# Patient Record
Sex: Female | Born: 1935 | Smoking: Never smoker
Health system: Southern US, Community
[De-identification: ages and names within clinical notes are randomized; demographics above are authoritative.]

## PROBLEM LIST (undated history)

## (undated) DIAGNOSIS — I10 Essential (primary) hypertension: Secondary | ICD-10-CM

---

## 2015-02-12 ENCOUNTER — Emergency Department (HOSPITAL_COMMUNITY): Payer: Medicare Other

## 2015-02-12 ENCOUNTER — Inpatient Hospital Stay (HOSPITAL_COMMUNITY)
Admission: EM | Admit: 2015-02-12 | Discharge: 2015-02-19 | DRG: 481 | Disposition: A | Discharge status: Skilled Nursing Facility | Payer: Medicare Other | Attending: Family Medicine | Admitting: Family Medicine

## 2015-02-12 ENCOUNTER — Encounter (HOSPITAL_COMMUNITY): Payer: Self-pay

## 2015-02-12 DIAGNOSIS — I444 Left anterior fascicular block: Secondary | ICD-10-CM | POA: Diagnosis present

## 2015-02-12 DIAGNOSIS — M25552 Pain in left hip: Secondary | ICD-10-CM | POA: Diagnosis present

## 2015-02-12 DIAGNOSIS — D6489 Other specified anemias: Secondary | ICD-10-CM | POA: Diagnosis not present

## 2015-02-12 DIAGNOSIS — I959 Hypotension, unspecified: Secondary | ICD-10-CM | POA: Diagnosis present

## 2015-02-12 DIAGNOSIS — S72002A Fracture of unspecified part of neck of left femur, initial encounter for closed fracture: Secondary | ICD-10-CM | POA: Diagnosis not present

## 2015-02-12 DIAGNOSIS — S72002D Fracture of unspecified part of neck of left femur, subsequent encounter for closed fracture with routine healing: Secondary | ICD-10-CM | POA: Diagnosis not present

## 2015-02-12 DIAGNOSIS — Z7901 Long term (current) use of anticoagulants: Secondary | ICD-10-CM

## 2015-02-12 DIAGNOSIS — S72142S Displaced intertrochanteric fracture of left femur, sequela: Secondary | ICD-10-CM | POA: Diagnosis not present

## 2015-02-12 DIAGNOSIS — S7290XA Unspecified fracture of unspecified femur, initial encounter for closed fracture: Secondary | ICD-10-CM | POA: Diagnosis present

## 2015-02-12 DIAGNOSIS — E860 Dehydration: Secondary | ICD-10-CM | POA: Diagnosis present

## 2015-02-12 DIAGNOSIS — I950 Idiopathic hypotension: Secondary | ICD-10-CM

## 2015-02-12 DIAGNOSIS — I10 Essential (primary) hypertension: Secondary | ICD-10-CM | POA: Diagnosis present

## 2015-02-12 DIAGNOSIS — S72142A Displaced intertrochanteric fracture of left femur, initial encounter for closed fracture: Secondary | ICD-10-CM | POA: Diagnosis not present

## 2015-02-12 DIAGNOSIS — T796XXA Traumatic ischemia of muscle, initial encounter: Secondary | ICD-10-CM | POA: Diagnosis not present

## 2015-02-12 DIAGNOSIS — M6282 Rhabdomyolysis: Secondary | ICD-10-CM | POA: Diagnosis not present

## 2015-02-12 DIAGNOSIS — Y92019 Unspecified place in single-family (private) house as the place of occurrence of the external cause: Secondary | ICD-10-CM

## 2015-02-12 DIAGNOSIS — Z880 Allergy status to penicillin: Secondary | ICD-10-CM

## 2015-02-12 DIAGNOSIS — Z419 Encounter for procedure for purposes other than remedying health state, unspecified: Secondary | ICD-10-CM

## 2015-02-12 DIAGNOSIS — Z8673 Personal history of transient ischemic attack (TIA), and cerebral infarction without residual deficits: Secondary | ICD-10-CM

## 2015-02-12 DIAGNOSIS — N390 Urinary tract infection, site not specified: Secondary | ICD-10-CM | POA: Diagnosis not present

## 2015-02-12 DIAGNOSIS — E46 Unspecified protein-calorie malnutrition: Secondary | ICD-10-CM | POA: Diagnosis present

## 2015-02-12 DIAGNOSIS — S72002S Fracture of unspecified part of neck of left femur, sequela: Secondary | ICD-10-CM | POA: Diagnosis not present

## 2015-02-12 DIAGNOSIS — Z833 Family history of diabetes mellitus: Secondary | ICD-10-CM

## 2015-02-12 DIAGNOSIS — W010XXA Fall on same level from slipping, tripping and stumbling without subsequent striking against object, initial encounter: Secondary | ICD-10-CM | POA: Diagnosis present

## 2015-02-12 DIAGNOSIS — N179 Acute kidney failure, unspecified: Secondary | ICD-10-CM | POA: Diagnosis not present

## 2015-02-12 DIAGNOSIS — Z5181 Encounter for therapeutic drug level monitoring: Secondary | ICD-10-CM | POA: Diagnosis not present

## 2015-02-12 DIAGNOSIS — I493 Ventricular premature depolarization: Secondary | ICD-10-CM | POA: Diagnosis present

## 2015-02-12 DIAGNOSIS — Z09 Encounter for follow-up examination after completed treatment for conditions other than malignant neoplasm: Secondary | ICD-10-CM

## 2015-02-12 DIAGNOSIS — D649 Anemia, unspecified: Secondary | ICD-10-CM | POA: Diagnosis not present

## 2015-02-12 DIAGNOSIS — H9190 Unspecified hearing loss, unspecified ear: Secondary | ICD-10-CM | POA: Diagnosis not present

## 2015-02-12 DIAGNOSIS — Z01810 Encounter for preprocedural cardiovascular examination: Secondary | ICD-10-CM | POA: Diagnosis not present

## 2015-02-12 DIAGNOSIS — R739 Hyperglycemia, unspecified: Secondary | ICD-10-CM | POA: Diagnosis present

## 2015-02-12 DIAGNOSIS — L8990 Pressure ulcer of unspecified site, unspecified stage: Secondary | ICD-10-CM | POA: Insufficient documentation

## 2015-02-12 DIAGNOSIS — N3000 Acute cystitis without hematuria: Secondary | ICD-10-CM | POA: Diagnosis not present

## 2015-02-12 DIAGNOSIS — S7292XS Unspecified fracture of left femur, sequela: Secondary | ICD-10-CM | POA: Diagnosis not present

## 2015-02-12 HISTORY — DX: Essential (primary) hypertension: I10

## 2015-02-12 LAB — CBC WITH DIFFERENTIAL/PLATELET
BASOS PCT: 0 % (ref 0–1)
Basophils Absolute: 0 10*3/uL (ref 0.0–0.1)
Eosinophils Absolute: 0 10*3/uL (ref 0.0–0.7)
Eosinophils Relative: 0 % (ref 0–5)
HEMATOCRIT: 34.6 % — AB (ref 36.0–46.0)
HEMOGLOBIN: 11.6 g/dL — AB (ref 12.0–15.0)
LYMPHS ABS: 1.6 10*3/uL (ref 0.7–4.0)
Lymphocytes Relative: 13 % (ref 12–46)
MCH: 30 pg (ref 26.0–34.0)
MCHC: 33.5 g/dL (ref 30.0–36.0)
MCV: 89.4 fL (ref 78.0–100.0)
MONOS PCT: 12 % (ref 3–12)
Monocytes Absolute: 1.5 10*3/uL — ABNORMAL HIGH (ref 0.1–1.0)
NEUTROS ABS: 9.7 10*3/uL — AB (ref 1.7–7.7)
NEUTROS PCT: 75 % (ref 43–77)
Platelets: 221 10*3/uL (ref 150–400)
RBC: 3.87 MIL/uL (ref 3.87–5.11)
RDW: 14.7 % (ref 11.5–15.5)
WBC: 12.9 10*3/uL — ABNORMAL HIGH (ref 4.0–10.5)

## 2015-02-12 LAB — PROTIME-INR
INR: 2.28 — AB (ref 0.00–1.49)
Prothrombin Time: 24.9 seconds — ABNORMAL HIGH (ref 11.6–15.2)

## 2015-02-12 LAB — COMPREHENSIVE METABOLIC PANEL
ALT: 19 U/L (ref 14–54)
ANION GAP: 11 (ref 5–15)
AST: 51 U/L — ABNORMAL HIGH (ref 15–41)
Albumin: 3.3 g/dL — ABNORMAL LOW (ref 3.5–5.0)
Alkaline Phosphatase: 50 U/L (ref 38–126)
BUN: 37 mg/dL — ABNORMAL HIGH (ref 6–20)
CHLORIDE: 107 mmol/L (ref 101–111)
CO2: 23 mmol/L (ref 22–32)
CREATININE: 1.71 mg/dL — AB (ref 0.44–1.00)
Calcium: 8.8 mg/dL — ABNORMAL LOW (ref 8.9–10.3)
GFR calc non Af Amer: 25 mL/min — ABNORMAL LOW (ref >60)
GFR, EST AFRICAN AMERICAN: 29 mL/min — AB (ref >60)
Glucose, Bld: 144 mg/dL — ABNORMAL HIGH (ref 65–99)
POTASSIUM: 4.2 mmol/L (ref 3.5–5.1)
SODIUM: 141 mmol/L (ref 135–145)
Total Bilirubin: 1 mg/dL (ref 0.3–1.2)
Total Protein: 6.5 g/dL (ref 6.5–8.1)

## 2015-02-12 LAB — I-STAT CHEM 8, ED
BUN: 38 mg/dL — AB (ref 6–20)
CALCIUM ION: 1.15 mmol/L (ref 1.13–1.30)
CHLORIDE: 106 mmol/L (ref 101–111)
CREATININE: 1.5 mg/dL — AB (ref 0.44–1.00)
GLUCOSE: 136 mg/dL — AB (ref 65–99)
HCT: 36 % (ref 36.0–46.0)
Hemoglobin: 12.2 g/dL (ref 12.0–15.0)
Potassium: 4.1 mmol/L (ref 3.5–5.1)
Sodium: 141 mmol/L (ref 135–145)
TCO2: 22 mmol/L (ref 0–100)

## 2015-02-12 LAB — ABO/RH: ABO/RH(D): A NEG

## 2015-02-12 LAB — CK: Total CK: 1200 U/L — ABNORMAL HIGH (ref 38–234)

## 2015-02-12 MED ORDER — FENTANYL CITRATE (PF) 100 MCG/2ML IJ SOLN
50.0000 ug | Freq: Once | INTRAMUSCULAR | Status: DC
  Filled 2015-02-12: qty 2

## 2015-02-12 MED ORDER — SODIUM CHLORIDE 0.9 % IV BOLUS (SEPSIS): 500.0000 mL | Freq: Once | INTRAVENOUS | Status: DC

## 2015-02-12 MED ORDER — HYDROCODONE-ACETAMINOPHEN 5-325 MG PO TABS
1.0000 | ORAL_TABLET | ORAL | Status: AC | PRN
  Administered 2015-02-12: 1 via ORAL
  Filled 2015-02-12: qty 1

## 2015-02-12 MED ORDER — SODIUM CHLORIDE 0.9 % IV BOLUS (SEPSIS)
1000.0000 mL | Freq: Once | INTRAVENOUS | Status: AC
  Administered 2015-02-12: 1000 mL via INTRAVENOUS

## 2015-02-12 MED ORDER — FENTANYL CITRATE (PF) 100 MCG/2ML IJ SOLN
100.0000 ug | INTRAMUSCULAR | Status: DC | PRN
  Filled 2015-02-12: qty 2

## 2015-02-12 MED ORDER — HYDROCODONE-ACETAMINOPHEN 5-325 MG PO TABS
1.0000 | ORAL_TABLET | Freq: Four times a day (QID) | ORAL | Status: DC | PRN
Start: 2015-02-12 — End: 2015-02-19
  Administered 2015-02-13 – 2015-02-16 (×3): 1 via ORAL
  Administered 2015-02-16: 2 via ORAL
  Administered 2015-02-16 – 2015-02-19 (×6): 1 via ORAL
  Filled 2015-02-12 (×6): qty 1
  Filled 2015-02-12: qty 2
  Filled 2015-02-12 (×3): qty 1

## 2015-02-12 MED ORDER — SODIUM CHLORIDE 0.9 % IV SOLN
INTRAVENOUS | Status: DC
  Administered 2015-02-12 – 2015-02-17 (×5): via INTRAVENOUS

## 2015-02-12 MED ORDER — ONDANSETRON HCL 4 MG/2ML IJ SOLN
4.0000 mg | Freq: Once | INTRAMUSCULAR | Status: DC
  Filled 2015-02-12: qty 2

## 2015-02-12 MED ORDER — MORPHINE SULFATE (PF) 2 MG/ML IV SOLN
0.5000 mg | INTRAVENOUS | Status: DC | PRN
Start: 2015-02-12 — End: 2015-02-19
  Administered 2015-02-14 – 2015-02-15 (×2): 0.5 mg via INTRAVENOUS
  Filled 2015-02-12 (×2): qty 1

## 2015-02-12 MED ORDER — PHYTONADIONE 5 MG PO TABS
5.0000 mg | ORAL_TABLET | Freq: Once | ORAL | Status: AC
  Administered 2015-02-12: 5 mg via ORAL
  Filled 2015-02-12: qty 1

## 2015-02-12 MED ORDER — ONDANSETRON HCL 4 MG/2ML IJ SOLN: 4.0000 mg | Freq: Three times a day (TID) | INTRAMUSCULAR | Status: AC | PRN

## 2015-02-12 NOTE — ED Provider Notes (Signed)
CSN: [id number]     Arrival date & time 02/12/15  1306 History   First MD Initiated Contact with Patient 02/12/15 1323     Chief Complaint  Patient presents with  . Fall     (Consider location/radiation/quality/duration/timing/severity/associated sxs/prior Treatment) HPI  Bridget Pratt Is a 81 year old female who presents emergency Department with chief complaint of left hip pain. Patient was found this morning lying on the floor by her post man. Patient states that last night she was going to bed. She went to turn off her television and when she turned back around as she twisted she fell. She is unsure if she hurt her leg prior to falling or if her leg was hurt after the fall. She did not lose consciousness. She is complaining of neck pain. She denies hitting her head. Patient laid on the floor all night. She denies hitting her head. She c/o left arm pain where she laid on it all night.  Past Medical History  Diagnosis Date  . Hypertension    No past surgical history on file. History reviewed. No pertinent family history. Social History  Substance Use Topics  . Smoking status: Never Smoker   . Smokeless tobacco: None  . Alcohol Use: No   OB History    No data available     Review of Systems  Ten systems reviewed and are negative for acute change, except as noted in the HPI.    Allergies  Review of patient's allergies indicates not on file.  Home Medications   Prior to Admission medications   Not on File   BP 84/34 mmHg  Pulse 80  Temp(Src) 97.9 F (36.6 C) (Oral)  Resp 22  SpO2 99%  LMP 02/12/2015 Physical Exam  Constitutional: She appears well-developed.  Frail elderly female. She appears to be in pain  HENT:  Head: Normocephalic and atraumatic.  Very dry, sticky oral mucosa. parched lips  Eyes: Conjunctivae and EOM are normal. Pupils are equal, round, and reactive to light.  Neck: No JVD present.  Midline cervical tenderness on palpation. Placed in  cervical collar  Cardiovascular: Normal rate, regular rhythm, normal heart sounds and intact distal pulses.   Pulmonary/Chest: Breath sounds normal. No respiratory distress. She has no wheezes.  No chest wall contusions  Abdominal: Soft. Bowel sounds are normal. She exhibits no distension. There is no tenderness.  No bruising  Musculoskeletal:       Legs: Skin: She is not diaphoretic.  Nursing note and vitals reviewed.   ED Course  Procedures (including critical care time) Labs Review Labs Reviewed - No data to display  Imaging Review No results found. I have personally reviewed and evaluated these images and lab results as part of my medical decision-making.   EKG Interpretation   Date/Time:  Saturday February 12 2015 13:16:53 EDT Ventricular Rate:  67 PR Interval:  149 QRS Duration: 109 QT Interval:  436 QTC Calculation: 460 R Axis:   -56 Text Interpretation:  Sinus rhythm Atrial premature complexes Incomplete  RBBB and LAFB Abnormal R-wave progression, early transition Probable left  ventricular hypertrophy Baseline wander in lead(s) V5 question lead  placement Confirmed by MESNER MD, Barbara Cower ([id number]) on 02/12/2015 1:39:07 PM      MDM   Final diagnoses:  Hip fracture, left, closed, initial encounter  Dehydration    1:47 PM Filed Vitals:   02/12/15 1318 02/12/15 1330 02/12/15 1333  BP: 84/34 124/43   Pulse: 80 70   Temp: 97.9 F (36.6 C)  TempSrc: Oral    Resp: 22 20   Height:   5' (1.524 m)  Weight:   125 lb (56.7 kg)  SpO2: 99% 94%    This is a 81 year old female with no medical record here for review. She fell last night and appears to have a left leg deformity. This may be femur fracture versus hip fracture. Patient has range of motion of the left arm, although she laid on it last night. I ordered a total CK to rule out rhabdo. She does appear dehydrated. An initial systolic pressure was 84. Patient given a bolus of fluid. Her pressures are already rising.  Patient given fentanyl for pain. Placed in c-collar, IV ordered CT of the cervical spine as well as CT head. She is also on Coumadin and will get a PT/INR. I have ordered preop screening. EKG reviewed. Dr. Clayborne Dana has asked for repeat.    Patient with left intertrochanteric hip fracture. Patient will be admitted by Dr.Dhungel Dr. Raeford Razor will consult on the patient. He asked that she remain nothing by mouth until he sees her. He also appears dehydrated with elevated creatinine, dry oral mucosa, hypotension which resolved with fluids. Her total CK is elevated at it. I doubt rhabdomyolysis , as it is not highly elevated.    Arthor Captain, PA-C 02/12/15 1652  Marily Memos, MD 02/12/15 1743

## 2015-02-12 NOTE — H&P (Addendum)
[facility] History and Physical  Bridget Pratt ZOX:[id number] DOB: 28-Feb-1920 DOA: 02/12/2015  Referring physician: Dr. Erin Hearing PCP: Ignatius Specking., MD   Chief Complaint:  Fall at home  HPI:  81 year old female who lives alone (with a close friend helping her with her errands), independent of her ADLs and uses walker for ambulation, history of hypertension and? Blood clots on warfarin was brought to the ED after sustaining a fall at home yesterday. She reports that last evening after switching off the TV and turning to go to bed she twisted and fell on the ground. She denies any loss of consciousness landed on her left side. Denies hitting her head or the neck. She reports being on the floor for almost 18 hours and unable to get up. She denies any nausea, vomiting, chest pain, shortness of breath, palpitations, fevers, chills, headache, blurred vision, bowel or urinary symptoms. She reports following up with her PCP every month regarding her blood clot medication. Patient does not have any close family or relatives. She has a close friend lives nearby.  Course in the ED Patient was hypotensive with blood pressure of 84/34 mmHg and received 1 L IV normal saline bolus after which her blood pressure improved. She was given 50 g of fentanyl for her left hip pain. Blood will done showed mild leukocytosis, hemoglobin of 11.6 and platelets of 221. Chemistry showed sodium of 141, K of 4.2, chloride 107, CO2 23, BUN of 37 creatinine 1.71. INR was 2.28, glucose of 144. CPK was elevated to 1200. No old labs to compare. CT of the head and cervical spine was done which was negative for any injury. An x-ray of the left hip showed displaced intertrochanteric fracture of the left femur. Chest x-ray was suggestive of mild CHF findings.  Orthopedic surgeon Dr. Veda Canning  was consulted and patient admitted to hospitalist service on telemetry.  Review of Systems:  Constitutional: Denies fever, chills,  diaphoresis, appetite change and fatigue.  HEENT: Complained of neck pain and headache .  Denies visual symptoms, photophobia, congestion, cough, Respiratory: Denies SOB, DOE, cough, chest tightness,  and wheezing.   Cardiovascular: Denies chest pain, palpitations and leg swelling.  Gastrointestinal: Denies nausea, vomiting, abdominal pain, diarrhea, constipation, blood in stool and abdominal distention.  Genitourinary: Denies dysuria,  hematuria, flank pain and difficulty urinating.  Endocrine: Denies: hot or cold intolerance,  polyuria, polydipsia. Musculoskeletal: left hip pain. Denies myalgias, back pain, joint swelling, arthralgias and gait problem.  Skin: Denies pallor, rash and wound.  Neurological: Denies dizziness, seizures, syncope, weakness, light-headedness, numbness  Hematological: Denies adenopathy.  Psychiatric/Behavioral: Denies confusion  Past Medical History  Diagnosis Date  . Hypertension    No past surgical history on file. Social History:  reports that she has never smoked. She does not have any smokeless tobacco history on file. She reports that she does not drink alcohol or use illicit drugs.  Allergies  Allergen Reactions  . Penicillins Hives    Family history  Father had diabetes   Prior to Admission medications   Patient reports being on an antihypertensives and? Coumadin. No medication list available.      Physical Exam:  Filed Vitals:   02/12/15 1415 02/12/15 1500 02/12/15 1600 02/12/15 1622  BP: 124/57 112/58 128/46 126/51  Pulse: 66 69 64 70  Temp:      TempSrc:      Resp: 17 25 18 19   Height:      Weight:      SpO2: 100% 97%  97% 100%    Constitutional: Vital signs reviewed.  Elderly female in no acute distress HEENT: no pallor, no icterus, dry oral mucosa, no cervical lymphadenopathy Cardiovascular: RRR, S1 normal, S2 normal, no MRG Chest: CTAB, no wheezes, rales, or rhonchi Abdominal: Soft. Non-tender, non-distended, bowel sounds are  normal,  Ext: warm, no edema, chronic skin changes or bilateral leg. Limited mobility of left leg  Neurological: Hard of hearing, alert and oriented, nonfocal  Labs on Admission:  Basic Metabolic Panel:  Recent Labs Lab 02/12/15 1357 02/12/15 1541  NA 141 141  K 4.2 4.1  CL 107 106  CO2 23  --   GLUCOSE 144* 136*  BUN 37* 38*  CREATININE 1.71* 1.50*  CALCIUM 8.8*  --    Liver Function Tests:  Recent Labs Lab 02/12/15 1357  AST 51*  ALT 19  ALKPHOS 50  BILITOT 1.0  PROT 6.5  ALBUMIN 3.3*   No results for input(s): LIPASE, AMYLASE in the last 168 hours. No results for input(s): AMMONIA in the last 168 hours. CBC:  Recent Labs Lab 02/12/15 1357 02/12/15 1541  WBC 12.9*  --   NEUTROABS 9.7*  --   HGB 11.6* 12.2  HCT 34.6* 36.0  MCV 89.4  --   PLT 221  --    Cardiac Enzymes:  Recent Labs Lab 02/12/15 1357  CKTOTAL 1200*   BNP: Invalid input(s): POCBNP CBG: No results for input(s): GLUCAP in the last 168 hours.  Radiological Exams on Admission: Ct Head Wo Contrast  02/12/2015   CLINICAL DATA:  Found on floor this morning. Fell last night. Left hip and neck pain. Patient on Coumadin. Hypertension.  EXAM: CT HEAD WITHOUT CONTRAST  CT CERVICAL SPINE WITHOUT CONTRAST  TECHNIQUE: Multidetector CT imaging of the head and cervical spine was performed following the standard protocol without intravenous contrast. Multiplanar CT image reconstructions of the cervical spine were also generated.  COMPARISON:  None.  FINDINGS: CT HEAD FINDINGS  There is atrophy and chronic small vessel disease changes. No acute intracranial abnormality. Specifically, no hemorrhage, hydrocephalus, mass lesion, acute infarction, or significant intracranial injury. No acute calvarial abnormality. Visualized paranasal sinuses and mastoids clear. Orbital soft tissues unremarkable.  CT CERVICAL SPINE FINDINGS  Normal alignment. Diffuse degenerative disc and facet disease throughout the cervical  spine. Prevertebral soft tissues are normal. No fracture. No epidural or paraspinal hematoma.  IMPRESSION: No acute intracranial abnormality.  Atrophy, chronic microvascular disease.  Cervical spondylosis.  No acute bony abnormality.   Electronically Signed   By: Charlett Nose M.D.   On: 02/12/2015 14:56   Ct Cervical Spine Wo Contrast  02/12/2015   CLINICAL DATA:  Found on floor this morning. Fell last night. Left hip and neck pain. Patient on Coumadin. Hypertension.  EXAM: CT HEAD WITHOUT CONTRAST  CT CERVICAL SPINE WITHOUT CONTRAST  TECHNIQUE: Multidetector CT imaging of the head and cervical spine was performed following the standard protocol without intravenous contrast. Multiplanar CT image reconstructions of the cervical spine were also generated.  COMPARISON:  None.  FINDINGS: CT HEAD FINDINGS  There is atrophy and chronic small vessel disease changes. No acute intracranial abnormality. Specifically, no hemorrhage, hydrocephalus, mass lesion, acute infarction, or significant intracranial injury. No acute calvarial abnormality. Visualized paranasal sinuses and mastoids clear. Orbital soft tissues unremarkable.  CT CERVICAL SPINE FINDINGS  Normal alignment. Diffuse degenerative disc and facet disease throughout the cervical spine. Prevertebral soft tissues are normal. No fracture. No epidural or paraspinal hematoma.  IMPRESSION: No acute intracranial  abnormality.  Atrophy, chronic microvascular disease.  Cervical spondylosis.  No acute bony abnormality.   Electronically Signed   By: Charlett Nose M.D.   On: 02/12/2015 14:56   Dg Chest Port 1 View  02/12/2015   CLINICAL DATA:  81 year old female with history of fall yesterday evening found down on the floor this morning.  EXAM: PORTABLE CHEST - 1 VIEW  COMPARISON:  No priors.  FINDINGS: Lung volumes are normal. No consolidative airspace disease. No pleural effusions. Diffuse peribronchial cuffing. Mild cephalization of the pulmonary vasculature. Mild  indistinctness of interstitial markings. Mild cardiomegaly. Slight prominence of upper mediastinal contours may be projectional. Atherosclerosis in the thoracic aorta.  IMPRESSION: 1. The appearance the chest suggests mild congestive heart failure. 2. Atherosclerosis.   Electronically Signed   By: Trudie Reed M.D.   On: 02/12/2015 14:23   Dg Hip Unilat With Pelvis 2-3 Views Left  02/12/2015   CLINICAL DATA:  Fall last night with left hip pain and left leg numbness.  EXAM: DG HIP (WITH OR WITHOUT PELVIS) 2-3V LEFT  COMPARISON:  None.  FINDINGS: Exam demonstrates moderate diffuse decreased bone mineralization. There are mild symmetric degenerative changes of the hips. Compression screw and lateral fixation plate is present over the right femur intact. There is a displaced intertrochanteric fracture of the left proximal femur with mild exaggerated coxa vara deformity. Moderate posterior displacement of the distal fragment.  There are degenerative changes of the spine and sacroiliac joints. Atherosclerotic plaque is present over the femoral arteries.  IMPRESSION: Displaced intertrochanteric fracture of the left femur.   Electronically Signed   By: Elberta Fortis M.D.   On: 02/12/2015 14:43   Dg Femur 1v Left  02/12/2015   CLINICAL DATA:  Pt. Fell last night - found by postman this AM. Pain left hip and numbness left leg.  EXAM: LEFT FEMUR 1 VIEW  COMPARISON:  None.  FINDINGS: Study includes the proximal femoral shaft through the knee. The hip is not visualized on this exam.  No fracture or bone lesion on the included field of view. Knee joint is normally aligned with medial joint space compartment narrowing.  Bones are demineralized.  There are vascular calcifications medially.  IMPRESSION: No fracture or acute finding on the limited imaging acquired.   Electronically Signed   By: Amie Portland M.D.   On: 02/12/2015 14:39    EKG: Normal sinus rhythm at 95, anterior fascicular block, PVCs, no ST-T changes  (no old EKG to compare.  Assessment/Plan Principal Problem:   Closed left hip fracture Secondary to mechanical fall. Hip fracture pathway initiated. Admit to telemetry.  IV hydration with normal saline. Keep nothing by mouth. -Pain control with when necessary Vicodin and low-dose morphine. -Added bowel regimen. -Orthopedic surgery consulted by ED physician. Patient at baseline is independent of her ADLs and ambulates with the help of a walker. Should benefit from surgery. Will need skilled nursing facility upon discharge as she lives alone and does not have enough help at home.. -Ordered Foley. -EKG reviewed showing PVCs with left anterior fascicular block. No old EKG to compare. Also given congestion on chest x-ray will obtain 2-D echo to evaluate EF and rule out any wall motion abnormality preoperatively.. Patient not on any better blocker.  Per orthopedic surgery, recommend her INR to improve to 1.5 or below and will plan on or possibly on Monday.  Active Problems:   Acute kidney injury Possibly secondary to dehydration. Check UA and FeNa. He has dehydrated on  exam. Will place on IV hydration and monitor. Avoid nephrotoxins.    Hypotension Possibly secondary to dehydration. Blood pressure improved with IV fluids in the ED. Patient on antihypertensives (not sure which one). Monitor closely.  History of blood clots on anticoagulation with Coumadin Therapeutic. Holding for surgery.    Rhabdomyolysis Possibly following fall and being on the floor for almost 18 hours. Check UA. Follow with IV hydration  Leukocytosis Mild. Possibly associated with stress and dehydration. Check UA.    Blood glucose elevated Check A1c    Protein calorie malnutrition Nutrition consulted  Awaiting further information on her home meds.   Diet: regular. Surgery possibly on Monday once INR improves  DVT prophylaxis: Therapeutic INR on Coumadin.    Code Status: Discussed CODE STATUS with her. Patient  was not able to understand clearly. Would like to have everything done possible and necessary at this time Family Communication:None at bedside Disposition Plan: Admit to telemetry  [facility], [facility] Pager [phone]  Total time spent on admission :70 minutes  If 7PM-7AM, please contact night-coverage [email or web address] Password [facility] 02/12/2015, 4:37 PM

## 2015-02-12 NOTE — Progress Notes (Signed)
Orthopedic Tech Progress Note Patient Details:  Bridget Pratt [birth year] [id number]  Patient ID: Bridget Pratt, female   DOB: [birth year], 81 y.o.   MRN: [id number]   [name] 02/12/2015, 7:36 PMTrapeze bar

## 2015-02-12 NOTE — Consult Note (Signed)
Bridget Pratt is an 81 y.o. female.    Chief Complaint: left hip pain  HPI: 81 y/o female with ground level fall earlier today walking to mail box. Pt found by postman earlier this morning. Mild pain with movement of right upper extremity otherwise denies any other injuries. Pt unsure of how she fell but alert and appropriate today.   PCP:  No primary care provider on file.  PMH: Past Medical History  Diagnosis Date  . Hypertension     PSH: No past surgical history on file.  Social History:  reports that she has never smoked. She does not have any smokeless tobacco history on file. She reports that she does not drink alcohol or use illicit drugs.  Allergies:  Allergies  Allergen Reactions  . Penicillins Hives    Medications: Current Facility-Administered Medications  Medication Dose Route Frequency Provider Last Rate Last Dose  . fentaNYL (SUBLIMAZE) injection 50 mcg  50 mcg Intravenous Once Margarita Mail, PA-C   Stopped at 02/12/15 1415  . ondansetron (ZOFRAN) injection 4 mg  4 mg Intravenous Once Margarita Mail, PA-C   Stopped at 02/12/15 1415   No current outpatient prescriptions on file.    Results for orders placed or performed during the hospital encounter of 02/12/15 (from the past 48 hour(s))  Type and screen     Status: None   Collection Time: 02/12/15  1:31 PM  Result Value Ref Range   ABO/RH(D) A NEG    Antibody Screen NEG    Sample Expiration 02/15/2015   CBC WITH DIFFERENTIAL     Status: Abnormal   Collection Time: 02/12/15  1:57 PM  Result Value Ref Range   WBC 12.9 (H) 4.0 - 10.5 K/uL   RBC 3.87 3.87 - 5.11 MIL/uL   Hemoglobin 11.6 (L) 12.0 - 15.0 g/dL   HCT 34.6 (L) 36.0 - 46.0 %   MCV 89.4 78.0 - 100.0 fL   MCH 30.0 26.0 - 34.0 pg   MCHC 33.5 30.0 - 36.0 g/dL   RDW 14.7 11.5 - 15.5 %   Platelets 221 150 - 400 K/uL   Neutrophils Relative % 75 43 - 77 %   Neutro Abs 9.7 (H) 1.7 - 7.7 K/uL   Lymphocytes Relative 13 12 - 46 %   Lymphs Abs 1.6  0.7 - 4.0 K/uL   Monocytes Relative 12 3 - 12 %   Monocytes Absolute 1.5 (H) 0.1 - 1.0 K/uL   Eosinophils Relative 0 0 - 5 %   Eosinophils Absolute 0.0 0.0 - 0.7 K/uL   Basophils Relative 0 0 - 1 %   Basophils Absolute 0.0 0.0 - 0.1 K/uL  Protime-INR     Status: Abnormal   Collection Time: 02/12/15  1:57 PM  Result Value Ref Range   Prothrombin Time 24.9 (H) 11.6 - 15.2 seconds   INR 2.28 (H) 0.00 - 1.49  Comprehensive metabolic panel     Status: Abnormal   Collection Time: 02/12/15  1:57 PM  Result Value Ref Range   Sodium 141 135 - 145 mmol/L   Potassium 4.2 3.5 - 5.1 mmol/L   Chloride 107 101 - 111 mmol/L   CO2 23 22 - 32 mmol/L   Glucose, Bld 144 (H) 65 - 99 mg/dL   BUN 37 (H) 6 - 20 mg/dL   Creatinine, Ser 1.71 (H) 0.44 - 1.00 mg/dL   Calcium 8.8 (L) 8.9 - 10.3 mg/dL   Total Protein 6.5 6.5 - 8.1 g/dL   Albumin  3.3 (L) 3.5 - 5.0 g/dL   AST 51 (H) 15 - 41 U/L   ALT 19 14 - 54 U/L   Alkaline Phosphatase 50 38 - 126 U/L   Total Bilirubin 1.0 0.3 - 1.2 mg/dL   GFR calc non Af Amer 25 (L) >60 mL/min   GFR calc Af Amer 29 (L) >60 mL/min    Comment: (NOTE) The eGFR has been calculated using the CKD EPI equation. This calculation has not been validated in all clinical situations. eGFR's persistently <60 mL/min signify possible Chronic Kidney Disease.    Anion gap 11 5 - 15  CK     Status: Abnormal   Collection Time: 02/12/15  1:57 PM  Result Value Ref Range   Total CK 1200 (H) 38 - 234 U/L  I-stat chem 8, ed     Status: Abnormal   Collection Time: 02/12/15  3:41 PM  Result Value Ref Range   Sodium 141 135 - 145 mmol/L   Potassium 4.1 3.5 - 5.1 mmol/L   Chloride 106 101 - 111 mmol/L   BUN 38 (H) 6 - 20 mg/dL   Creatinine, Ser 1.50 (H) 0.44 - 1.00 mg/dL   Glucose, Bld 136 (H) 65 - 99 mg/dL   Calcium, Ion 1.15 1.13 - 1.30 mmol/L   TCO2 22 0 - 100 mmol/L   Hemoglobin 12.2 12.0 - 15.0 g/dL   HCT 36.0 36.0 - 46.0 %   Ct Head Wo Contrast  02/12/2015   CLINICAL DATA:   Found on floor this morning. Fell last night. Left hip and neck pain. Patient on Coumadin. Hypertension.  EXAM: CT HEAD WITHOUT CONTRAST  CT CERVICAL SPINE WITHOUT CONTRAST  TECHNIQUE: Multidetector CT imaging of the head and cervical spine was performed following the standard protocol without intravenous contrast. Multiplanar CT image reconstructions of the cervical spine were also generated.  COMPARISON:  None.  FINDINGS: CT HEAD FINDINGS  There is atrophy and chronic small vessel disease changes. No acute intracranial abnormality. Specifically, no hemorrhage, hydrocephalus, mass lesion, acute infarction, or significant intracranial injury. No acute calvarial abnormality. Visualized paranasal sinuses and mastoids clear. Orbital soft tissues unremarkable.  CT CERVICAL SPINE FINDINGS  Normal alignment. Diffuse degenerative disc and facet disease throughout the cervical spine. Prevertebral soft tissues are normal. No fracture. No epidural or paraspinal hematoma.  IMPRESSION: No acute intracranial abnormality.  Atrophy, chronic microvascular disease.  Cervical spondylosis.  No acute bony abnormality.   Electronically Signed   By: Rolm Baptise M.D.   On: 02/12/2015 14:56   Ct Cervical Spine Wo Contrast  02/12/2015   CLINICAL DATA:  Found on floor this morning. Fell last night. Left hip and neck pain. Patient on Coumadin. Hypertension.  EXAM: CT HEAD WITHOUT CONTRAST  CT CERVICAL SPINE WITHOUT CONTRAST  TECHNIQUE: Multidetector CT imaging of the head and cervical spine was performed following the standard protocol without intravenous contrast. Multiplanar CT image reconstructions of the cervical spine were also generated.  COMPARISON:  None.  FINDINGS: CT HEAD FINDINGS  There is atrophy and chronic small vessel disease changes. No acute intracranial abnormality. Specifically, no hemorrhage, hydrocephalus, mass lesion, acute infarction, or significant intracranial injury. No acute calvarial abnormality. Visualized  paranasal sinuses and mastoids clear. Orbital soft tissues unremarkable.  CT CERVICAL SPINE FINDINGS  Normal alignment. Diffuse degenerative disc and facet disease throughout the cervical spine. Prevertebral soft tissues are normal. No fracture. No epidural or paraspinal hematoma.  IMPRESSION: No acute intracranial abnormality.  Atrophy, chronic microvascular disease.  Cervical spondylosis.  No acute bony abnormality.   Electronically Signed   By: Rolm Baptise M.D.   On: 02/12/2015 14:56   Dg Chest Port 1 View  02/12/2015   CLINICAL DATA:  81 year old female with history of fall yesterday evening found down on the floor this morning.  EXAM: PORTABLE CHEST - 1 VIEW  COMPARISON:  No priors.  FINDINGS: Lung volumes are normal. No consolidative airspace disease. No pleural effusions. Diffuse peribronchial cuffing. Mild cephalization of the pulmonary vasculature. Mild indistinctness of interstitial markings. Mild cardiomegaly. Slight prominence of upper mediastinal contours may be projectional. Atherosclerosis in the thoracic aorta.  IMPRESSION: 1. The appearance the chest suggests mild congestive heart failure. 2. Atherosclerosis.   Electronically Signed   By: Vinnie Langton M.D.   On: 02/12/2015 14:23   Dg Hip Unilat With Pelvis 2-3 Views Left  02/12/2015   CLINICAL DATA:  Fall last night with left hip pain and left leg numbness.  EXAM: DG HIP (WITH OR WITHOUT PELVIS) 2-3V LEFT  COMPARISON:  None.  FINDINGS: Exam demonstrates moderate diffuse decreased bone mineralization. There are mild symmetric degenerative changes of the hips. Compression screw and lateral fixation plate is present over the right femur intact. There is a displaced intertrochanteric fracture of the left proximal femur with mild exaggerated coxa vara deformity. Moderate posterior displacement of the distal fragment.  There are degenerative changes of the spine and sacroiliac joints. Atherosclerotic plaque is present over the femoral arteries.   IMPRESSION: Displaced intertrochanteric fracture of the left femur.   Electronically Signed   By: Marin Olp M.D.   On: 02/12/2015 14:43   Dg Femur 1v Left  02/12/2015   CLINICAL DATA:  Pt. Fell last night - found by postman this AM. Pain left hip and numbness left leg.  EXAM: LEFT FEMUR 1 VIEW  COMPARISON:  None.  FINDINGS: Study includes the proximal femoral shaft through the knee. The hip is not visualized on this exam.  No fracture or bone lesion on the included field of view. Knee joint is normally aligned with medial joint space compartment narrowing.  Bones are demineralized.  There are vascular calcifications medially.  IMPRESSION: No fracture or acute finding on the limited imaging acquired.   Electronically Signed   By: Lajean Manes M.D.   On: 02/12/2015 14:39    ROS: ROS Lives at home Currently unable to ambulate due to recent fall  Physical Exam: Alert and appropriate 81 y/o female in no acute distress Cervical spine with full rom and no tenderness Right upper extremity: full rom with mild pain but no deformity or crepitus Left upper extremity with full rom no tenderness Left pelvis tender with mild shortening/external rotation of left lower leg Right lower extremity with full rom, no tenderness or deformity Previous right hip fracture scar from 4-5 years  Physical Exam   Assessment/Plan Assessment: left intertroch femur fracture  Plan: Medical team to admit and plan for surgical management of the left femur once INR is appropriate Recommend oral vitamin K today Plan for surgery once INR improves, likely Monday NPO for now Strict non weight bearing foley

## 2015-02-12 NOTE — ED Provider Notes (Signed)
Medical screening examination/treatment/procedure(s) were conducted as a shared visit with non-physician practitioner(s) and myself.  I personally evaluated the patient during the encounter.  Fell last night. Severe left hip pain and couldn't get up. No other injuries. Left leg shortened with pain on minimal ROM. Pulse intact. No obvious injuries. Will confirm hip fx w/ x-rays and admit to medicine.    EKG Interpretation   Date/Time:  Saturday February 12 2015 13:16:53 EDT Ventricular Rate:  67 PR Interval:  149 QRS Duration: 109 QT Interval:  436 QTC Calculation: 460 R Axis:   -56 Text Interpretation:  Sinus rhythm Atrial premature complexes Incomplete  RBBB and LAFB Abnormal R-wave progression, early transition Probable left  ventricular hypertrophy Baseline wander in lead(s) V5 question lead  placement Confirmed by [facility] MD, Kelise Kuch [phone]) on 02/12/2015 1:39:07 PM        Marily Memos, MD 02/12/15 1806

## 2015-02-12 NOTE — ED Notes (Signed)
MD at bedside. 

## 2015-02-12 NOTE — ED Notes (Addendum)
Pt. Was found this morning by the postman laying on the floor.  She reports that she fell last night before dark and was unable to get up.  She has lt. Leg and hip pain shortening noted. No rotation noted . Pt. Denies any other pain.  She was given Morphine 4 mg IV en route.  She reports that her lt. Leg and hip feels numb.  Pt. Is alert and oriented X 4.   Paramedics reports pt.  lives alone and her neighbor was going to notify her son.  Pt. Denies any chest pain no sob.  Pt. Is Hard of hearing.  Skin is warm and dry. She also reports that her Rt. Arm is sore due to her laying on it all night.  She reports also having neck pain .

## 2015-02-13 DIAGNOSIS — Z7901 Long term (current) use of anticoagulants: Secondary | ICD-10-CM

## 2015-02-13 DIAGNOSIS — S72002D Fracture of unspecified part of neck of left femur, subsequent encounter for closed fracture with routine healing: Secondary | ICD-10-CM

## 2015-02-13 DIAGNOSIS — Z5181 Encounter for therapeutic drug level monitoring: Secondary | ICD-10-CM

## 2015-02-13 DIAGNOSIS — D6489 Other specified anemias: Secondary | ICD-10-CM

## 2015-02-13 LAB — IRON AND TIBC
IRON: 60 ug/dL (ref 28–170)
Saturation Ratios: 27 % (ref 10.4–31.8)
TIBC: 220 ug/dL — AB (ref 250–450)
UIBC: 160 ug/dL

## 2015-02-13 LAB — CBC
HCT: 25.3 % — ABNORMAL LOW (ref 36.0–46.0)
Hemoglobin: 8.5 g/dL — ABNORMAL LOW (ref 12.0–15.0)
MCH: 30.4 pg (ref 26.0–34.0)
MCHC: 33.6 g/dL (ref 30.0–36.0)
MCV: 90.4 fL (ref 78.0–100.0)
PLATELETS: 157 10*3/uL (ref 150–400)
RBC: 2.8 MIL/uL — ABNORMAL LOW (ref 3.87–5.11)
RDW: 14.9 % (ref 11.5–15.5)
WBC: 9.4 10*3/uL (ref 4.0–10.5)

## 2015-02-13 LAB — BASIC METABOLIC PANEL
ANION GAP: 7 (ref 5–15)
BUN: 33 mg/dL — AB (ref 6–20)
CALCIUM: 7.7 mg/dL — AB (ref 8.9–10.3)
CO2: 22 mmol/L (ref 22–32)
Chloride: 109 mmol/L (ref 101–111)
Creatinine, Ser: 1.28 mg/dL — ABNORMAL HIGH (ref 0.44–1.00)
GFR calc Af Amer: 40 mL/min — ABNORMAL LOW (ref >60)
GFR, EST NON AFRICAN AMERICAN: 35 mL/min — AB (ref >60)
GLUCOSE: 132 mg/dL — AB (ref 65–99)
Potassium: 3.9 mmol/L (ref 3.5–5.1)
SODIUM: 138 mmol/L (ref 135–145)

## 2015-02-13 LAB — URINE MICROSCOPIC-ADD ON

## 2015-02-13 LAB — URINALYSIS, ROUTINE W REFLEX MICROSCOPIC
BILIRUBIN URINE: NEGATIVE
Glucose, UA: NEGATIVE mg/dL
KETONES UR: NEGATIVE mg/dL
NITRITE: NEGATIVE
Protein, ur: NEGATIVE mg/dL
Specific Gravity, Urine: 1.02 (ref 1.005–1.030)
UROBILINOGEN UA: 0.2 mg/dL (ref 0.0–1.0)
pH: 5 (ref 5.0–8.0)

## 2015-02-13 LAB — PROTIME-INR
INR: 2.43 — ABNORMAL HIGH (ref 0.00–1.49)
Prothrombin Time: 26.2 seconds — ABNORMAL HIGH (ref 11.6–15.2)

## 2015-02-13 LAB — VITAMIN B12: VITAMIN B 12: 377 pg/mL (ref 180–914)

## 2015-02-13 LAB — CREATININE, URINE, RANDOM: Creatinine, Urine: 100.35 mg/dL

## 2015-02-13 LAB — CK: Total CK: 891 U/L — ABNORMAL HIGH (ref 38–234)

## 2015-02-13 LAB — PREPARE RBC (CROSSMATCH)

## 2015-02-13 LAB — SODIUM, URINE, RANDOM: SODIUM UR: 100 mmol/L

## 2015-02-13 MED ORDER — VITAMIN K1 10 MG/ML IJ SOLN
5.0000 mg | Freq: Once | INTRAMUSCULAR | Status: AC
  Administered 2015-02-13: 5 mg via SUBCUTANEOUS
  Filled 2015-02-13: qty 0.5

## 2015-02-13 MED ORDER — SODIUM CHLORIDE 0.9 % IV SOLN: Freq: Once | INTRAVENOUS | Status: DC

## 2015-02-13 MED ORDER — ACETAMINOPHEN 325 MG PO TABS
650.0000 mg | ORAL_TABLET | Freq: Once | ORAL | Status: AC
  Administered 2015-02-13: 650 mg via ORAL
  Filled 2015-02-13: qty 2

## 2015-02-13 NOTE — Progress Notes (Signed)
Hgb 8.5 today INR 2.4  Needs IM nail L hip when medically ready Recommend transfusion of PRBCs preop Hold coumadin OR tomorrow hopefully

## 2015-02-13 NOTE — Progress Notes (Addendum)
[facility] PROGRESS NOTE  Bridget Pratt ZOX:[id number] DOB: 1920/04/01 DOA: 02/12/2015 PCP: Ignatius Specking., MD  Brief narrative: 81 y/o female with HTN, on coumadin for ?DVT, independent for ADLs and uses walker for ambulation presented to the ED after sustaining a fall at home with left hip fracture. She also had elevated CPK and acute kidney injury.   Assessment/Plan: Closed left hip fracture Secondary to mechanical fall. Pain control with when necessary Vicodin and low-dose morphine. Bowel regimen added. Foley placed on admission. Holding Coumadin for surgery. Ordered vitamin K for INR reversal. 2-D echo ordered to evaluate LV function.  Acute kidney injury Secondary to dehydration and patient on HCTZ-valsartan. Blood pressure medications held.. Improving with IV fluids  Acute rhabdomyolysis Secondary to fall and being on the floor for several hours. Patient also on statin which has been discontinued. Improving with IV fluids. LFTs normal.  Anemia Significant drop in H&H since admission. No baseline unknown. Check stool for occult blood. 2 2 units PRBC ordered.  Protein calorie malnutrition Nutrition consult.  DVT prophylaxis: INR supratherapeutic  Diet: Regular  Code Staff: full code Family Communication: will son in [place] to update (phone [phone]) Disposition Plan: currently inpt   Consult Orthopedics  Procedures:  CT head and cervical spine   antibiotics None  Subjective Patient seen and examined. Reports her pain to be stable with medications.  Objective: Filed Vitals:   02/13/15 0522  BP: 122/45  Pulse: 67  Temp: 98.6 F (37 C)  Resp: 17    Intake/Output Summary (Last 24 hours) at 02/13/15 1134 Last data filed at 02/13/15 1040  Gross per 24 hour  Intake    620 ml  Output    400 ml  Net    220 ml   Filed Weights   02/12/15 1333  Weight: 56.7 kg (125 lb)    Exam:   Elderly female in no acute distress, hard of  hearing  HEENT: No pallor, moist oral mucosa  Chest: Clear to auscultation bilaterally  CVS: Normal S1 and S2, no murmurs rub or gallop  GI: Soft, nondistended, nontender, bowel sounds present  musculoskeletal: Warm, no edema  CNS: Alert and oriented  Data Reviewed: Basic Metabolic Panel:  Recent Labs Lab 02/12/15 1357 02/12/15 1541 02/13/15 0539  NA 141 141 138  K 4.2 4.1 3.9  CL 107 106 109  CO2 23  --  22  GLUCOSE 144* 136* 132*  BUN 37* 38* 33*  CREATININE 1.71* 1.50* 1.28*  CALCIUM 8.8*  --  7.7*   Liver Function Tests:  Recent Labs Lab 02/12/15 1357  AST 51*  ALT 19  ALKPHOS 50  BILITOT 1.0  PROT 6.5  ALBUMIN 3.3*   No results for input(s): LIPASE, AMYLASE in the last 168 hours. No results for input(s): AMMONIA in the last 168 hours. CBC:  Recent Labs Lab 02/12/15 1357 02/12/15 1541 02/13/15 0539  WBC 12.9*  --  9.4  NEUTROABS 9.7*  --   --   HGB 11.6* 12.2 8.5*  HCT 34.6* 36.0 25.3*  MCV 89.4  --  90.4  PLT 221  --  157   Cardiac Enzymes:  Recent Labs Lab 02/12/15 1357 02/13/15 0539  CKTOTAL 1200* 891*   BNP (last 3 results) No results for input(s): BNP in the last 8760 hours.  ProBNP (last 3 results) No results for input(s): PROBNP in the last 8760 hours.  CBG: No results for input(s): GLUCAP in the last 168 hours.  No results found for this  or any previous visit (from the past 240 hour(s)).   Studies: Ct Head Wo Contrast  02/12/2015   CLINICAL DATA:  Found on floor this morning. Fell last night. Left hip and neck pain. Patient on Coumadin. Hypertension.  EXAM: CT HEAD WITHOUT CONTRAST  CT CERVICAL SPINE WITHOUT CONTRAST  TECHNIQUE: Multidetector CT imaging of the head and cervical spine was performed following the standard protocol without intravenous contrast. Multiplanar CT image reconstructions of the cervical spine were also generated.  COMPARISON:  None.  FINDINGS: CT HEAD FINDINGS  There is atrophy and chronic small vessel  disease changes. No acute intracranial abnormality. Specifically, no hemorrhage, hydrocephalus, mass lesion, acute infarction, or significant intracranial injury. No acute calvarial abnormality. Visualized paranasal sinuses and mastoids clear. Orbital soft tissues unremarkable.  CT CERVICAL SPINE FINDINGS  Normal alignment. Diffuse degenerative disc and facet disease throughout the cervical spine. Prevertebral soft tissues are normal. No fracture. No epidural or paraspinal hematoma.  IMPRESSION: No acute intracranial abnormality.  Atrophy, chronic microvascular disease.  Cervical spondylosis.  No acute bony abnormality.   Electronically Signed   By: Charlett Nose M.D.   On: 02/12/2015 14:56   Ct Cervical Spine Wo Contrast  02/12/2015   CLINICAL DATA:  Found on floor this morning. Fell last night. Left hip and neck pain. Patient on Coumadin. Hypertension.  EXAM: CT HEAD WITHOUT CONTRAST  CT CERVICAL SPINE WITHOUT CONTRAST  TECHNIQUE: Multidetector CT imaging of the head and cervical spine was performed following the standard protocol without intravenous contrast. Multiplanar CT image reconstructions of the cervical spine were also generated.  COMPARISON:  None.  FINDINGS: CT HEAD FINDINGS  There is atrophy and chronic small vessel disease changes. No acute intracranial abnormality. Specifically, no hemorrhage, hydrocephalus, mass lesion, acute infarction, or significant intracranial injury. No acute calvarial abnormality. Visualized paranasal sinuses and mastoids clear. Orbital soft tissues unremarkable.  CT CERVICAL SPINE FINDINGS  Normal alignment. Diffuse degenerative disc and facet disease throughout the cervical spine. Prevertebral soft tissues are normal. No fracture. No epidural or paraspinal hematoma.  IMPRESSION: No acute intracranial abnormality.  Atrophy, chronic microvascular disease.  Cervical spondylosis.  No acute bony abnormality.   Electronically Signed   By: Charlett Nose M.D.   On: 02/12/2015  14:56   Dg Chest Port 1 View  02/12/2015   CLINICAL DATA:  82 year old female with history of fall yesterday evening found down on the floor this morning.  EXAM: PORTABLE CHEST - 1 VIEW  COMPARISON:  No priors.  FINDINGS: Lung volumes are normal. No consolidative airspace disease. No pleural effusions. Diffuse peribronchial cuffing. Mild cephalization of the pulmonary vasculature. Mild indistinctness of interstitial markings. Mild cardiomegaly. Slight prominence of upper mediastinal contours may be projectional. Atherosclerosis in the thoracic aorta.  IMPRESSION: 1. The appearance the chest suggests mild congestive heart failure. 2. Atherosclerosis.   Electronically Signed   By: Trudie Reed M.D.   On: 02/12/2015 14:23   Dg Hip Unilat With Pelvis 2-3 Views Left  02/12/2015   CLINICAL DATA:  Fall last night with left hip pain and left leg numbness.  EXAM: DG HIP (WITH OR WITHOUT PELVIS) 2-3V LEFT  COMPARISON:  None.  FINDINGS: Exam demonstrates moderate diffuse decreased bone mineralization. There are mild symmetric degenerative changes of the hips. Compression screw and lateral fixation plate is present over the right femur intact. There is a displaced intertrochanteric fracture of the left proximal femur with mild exaggerated coxa vara deformity. Moderate posterior displacement of the distal fragment.  There  are degenerative changes of the spine and sacroiliac joints. Atherosclerotic plaque is present over the femoral arteries.  IMPRESSION: Displaced intertrochanteric fracture of the left femur.   Electronically Signed   By: Elberta Fortis M.D.   On: 02/12/2015 14:43   Dg Femur 1v Left  02/12/2015   CLINICAL DATA:  Pt. Fell last night - found by postman this AM. Pain left hip and numbness left leg.  EXAM: LEFT FEMUR 1 VIEW  COMPARISON:  None.  FINDINGS: Study includes the proximal femoral shaft through the knee. The hip is not visualized on this exam.  No fracture or bone lesion on the included field of  view. Knee joint is normally aligned with medial joint space compartment narrowing.  Bones are demineralized.  There are vascular calcifications medially.  IMPRESSION: No fracture or acute finding on the limited imaging acquired.   Electronically Signed   By: Amie Portland M.D.   On: 02/12/2015 14:39    Scheduled Meds: . sodium chloride   Intravenous Once  . fentaNYL (SUBLIMAZE) injection  50 mcg Intravenous Once  . ondansetron (ZOFRAN) IV  4 mg Intravenous Once   Continuous Infusions: . sodium chloride 100 mL/hr at 02/12/15 1818      Time spent: 25 MINUTES    Bridget Pratt  [facility] Pager  [phone]. If 7PM-7AM, please contact night-coverage at [email or web address], password [facility] 02/13/2015, 11:34 AM  LOS: 1 day

## 2015-02-13 NOTE — Progress Notes (Signed)
Lab called this RN and informed about patient's hgb level from 12.2 yesterday drastically went down to 8.5. Will pass to oncoming nurse. Nursing will continue to monitor.

## 2015-02-13 NOTE — Progress Notes (Addendum)
Patient has an order for RBC transfusion. Before transfusion , pt temp was 100.1. Notified Dr. Gonzella Lex. And he said that continue with blood transfusion order. Patient received RBC transfusion per MD order.

## 2015-02-14 ENCOUNTER — Inpatient Hospital Stay (HOSPITAL_COMMUNITY): Payer: Medicare Other

## 2015-02-14 ENCOUNTER — Encounter (HOSPITAL_COMMUNITY): Payer: Self-pay | Admitting: *Deleted

## 2015-02-14 DIAGNOSIS — N3000 Acute cystitis without hematuria: Secondary | ICD-10-CM

## 2015-02-14 DIAGNOSIS — Z01810 Encounter for preprocedural cardiovascular examination: Secondary | ICD-10-CM

## 2015-02-14 LAB — BASIC METABOLIC PANEL
ANION GAP: 7 (ref 5–15)
BUN: 23 mg/dL — AB (ref 6–20)
CALCIUM: 7.7 mg/dL — AB (ref 8.9–10.3)
CO2: 22 mmol/L (ref 22–32)
Chloride: 109 mmol/L (ref 101–111)
Creatinine, Ser: 1.18 mg/dL — ABNORMAL HIGH (ref 0.44–1.00)
GFR calc Af Amer: 45 mL/min — ABNORMAL LOW (ref >60)
GFR, EST NON AFRICAN AMERICAN: 39 mL/min — AB (ref >60)
GLUCOSE: 112 mg/dL — AB (ref 65–99)
POTASSIUM: 3.9 mmol/L (ref 3.5–5.1)
Sodium: 138 mmol/L (ref 135–145)

## 2015-02-14 LAB — CBC
HEMATOCRIT: 29.4 % — AB (ref 36.0–46.0)
Hemoglobin: 9.7 g/dL — ABNORMAL LOW (ref 12.0–15.0)
MCH: 28.9 pg (ref 26.0–34.0)
MCHC: 33 g/dL (ref 30.0–36.0)
MCV: 87.5 fL (ref 78.0–100.0)
PLATELETS: 133 10*3/uL — AB (ref 150–400)
RBC: 3.36 MIL/uL — ABNORMAL LOW (ref 3.87–5.11)
RDW: 15.9 % — AB (ref 11.5–15.5)
WBC: 9.6 10*3/uL (ref 4.0–10.5)

## 2015-02-14 LAB — SURGICAL PCR SCREEN
MRSA, PCR: NEGATIVE
STAPHYLOCOCCUS AUREUS: NEGATIVE

## 2015-02-14 LAB — PROTIME-INR
INR: 1.49 (ref 0.00–1.49)
Prothrombin Time: 18.1 seconds — ABNORMAL HIGH (ref 11.6–15.2)

## 2015-02-14 LAB — CK: Total CK: 573 U/L — ABNORMAL HIGH (ref 38–234)

## 2015-02-14 MED ORDER — WHITE PETROLATUM GEL
Status: AC
  Administered 2015-02-14: 22:00:00
  Filled 2015-02-14: qty 1

## 2015-02-14 MED ORDER — HEPARIN (PORCINE) IN NACL 100-0.45 UNIT/ML-% IJ SOLN
1000.0000 [IU]/h | INTRAMUSCULAR | Status: DC
  Administered 2015-02-14: 1000 [IU]/h via INTRAVENOUS
  Filled 2015-02-14: qty 250

## 2015-02-14 MED ORDER — DEXTROSE 5 % IV SOLN
1.0000 g | INTRAVENOUS | Status: DC
  Administered 2015-02-14 – 2015-02-19 (×6): 1 g via INTRAVENOUS
  Filled 2015-02-14 (×6): qty 10

## 2015-02-14 MED ORDER — ENSURE ENLIVE PO LIQD
237.0000 mL | Freq: Two times a day (BID) | ORAL | Status: DC
  Administered 2015-02-15 – 2015-02-19 (×8): 237 mL via ORAL

## 2015-02-14 MED ORDER — INFLUENZA VAC SPLIT QUAD 0.5 ML IM SUSY
0.5000 mL | PREFILLED_SYRINGE | INTRAMUSCULAR | Status: AC
Start: 2015-02-15 — End: 2015-02-16
  Administered 2015-02-16: 0.5 mL via INTRAMUSCULAR
  Filled 2015-02-14: qty 0.5

## 2015-02-14 NOTE — Progress Notes (Signed)
ANTICOAGULATION CONSULT NOTE - Initial Consult  Pharmacy Consult for Heparin Indication: DVT  Allergies  Allergen Reactions  . Penicillins Hives    Patient Measurements: Height: 5' (152.4 cm) Weight: 125 lb (56.7 kg) IBW/kg (Calculated) : 45.5 Heparin Dosing Weight: 56.7 kg  Vital Signs: Temp: 98.5 F (36.9 C) (08/29 1942) Temp Source: Oral (08/29 1942) BP: 136/58 mmHg (08/29 1942) Pulse Rate: 62 (08/29 1942)  Labs:  Recent Labs  02/12/15 1357 02/12/15 1541 02/13/15 0539 02/14/15 0341  HGB 11.6* 12.2 8.5* 9.7*  HCT 34.6* 36.0 25.3* 29.4*  PLT 221  --  157 133*  LABPROT 24.9*  --  26.2* 18.1*  INR 2.28*  --  2.43* 1.49  CREATININE 1.71* 1.50* 1.28* 1.18*  CKTOTAL 1200*  --  891* 573*    Estimated Creatinine Clearance: 23.5 mL/min (by C-G formula based on Cr of 1.18).   Medical History: Past Medical History  Diagnosis Date  . Hypertension     Assessment: 81 year old female on PTA warfarin for DVT who presented to the ED after a mechanical fall. She was supposed to go to surgery today for left hip fracture but it has been postponed until tomorrow (02/15/15). Per Dr. Linna Caprice, start heparin drip tonight and then stop tomorrow morning at 0800.  Goal of Therapy:  Heparin level 0.3-0.7 units/ml Monitor platelets by anticoagulation protocol: Yes   Plan:  - Start heparin 100 units/hr with no bolus - Place stop time for heparin (0800 on 02/15/15) - Follow up plans for anticoagulation after surgery 02/15/15  Juanita Craver, PharmD, BCPS Clinical Pharmacist [phone]

## 2015-02-14 NOTE — Progress Notes (Signed)
Due to OR scheduling, surgery postponed to tomorrow around 2pm.   OK to start heparin gtt tonight, stop tomorrow morning (02/15/15) at 0800 NPO after MN tonight

## 2015-02-14 NOTE — Progress Notes (Signed)
[facility] PROGRESS NOTE  Bridget Pratt MVH:[id number] DOB: [birth year] DOA: 02/12/2015 PCP: Ignatius Specking., MD  Brief narrative: 81 y/o female with HTN, on coumadin for ?DVT, independent for ADLs and uses walker for ambulation presented to the ED after sustaining a fall at home with left hip fracture. She also had elevated CPK and acute kidney injury.   Assessment/Plan: Closed left hip fracture Secondary to mechanical fall. Pain control with when necessary Vicodin and low-dose morphine. Bowel regimen . Foley placed on admission. Holding Coumadin for surgery.  -INR reversed with vitamin K. <1.5 this morning. 2-D echo ordered to evaluate LV function.  Acute kidney injury Secondary to dehydration and patient on HCTZ-valsartan. Blood pressure medications held.. Improving with IV fluids  Acute rhabdomyolysis Secondary to fall and being on the floor for several hours. Patient also on statin which has been discontinued. Improving with IV fluids. LFTs normal.  Fever on 8/28 MAXIMUM TEMPERATURE of 100.4. Admission chest x-ray unremarkable but UA suggests a possible UTI. Will place on empiric Rocephin and monitor..   Anemia Significant drop in H&H since admission. No baseline unknown.  Stool for occult blood pending. Improved with 2 units PRBC on 8/28.  Protein calorie malnutrition Nutrition consult.  DVT prophylaxis: Resume  Diet: Regular  Code Staff: full code Family Communication: Spoke with son in [place] on 8/28 (phone [phone]) Disposition Plan: Needs skilled nursing facility postop. Possible discharge on 8/31   Consult Orthopedics (Dr Veda Canning)  Procedures:  CT head and cervical spine   antibiotics None  Subjective Patient seen and examined. Left hip pain better. Received 2 u PRBC yesterday . Had fever of 100.38F last evening. Denies any cough, chest congestion or dysuria.  Objective: Filed Vitals:   02/14/15 0511  BP: 131/55  Pulse: 73  Temp: 100.2 F  (37.9 C)  Resp: 17    Intake/Output Summary (Last 24 hours) at 02/14/15 0931 Last data filed at 02/14/15 2725  Gross per 24 hour  Intake   2470 ml  Output   1775 ml  Net    695 ml   Filed Weights   02/12/15 1333  Weight: 56.7 kg (125 lb)    Exam:   Elderly female in no acute distress, hard of hearing  HEENT: No pallor, moist oral mucosa  Chest: Clear to auscultation bilaterally  CVS: Normal S1 and S2, no murmurs rub or gallop  GI: Soft, nondistended, nontender, bowel sounds present , Foley placed on admission.  musculoskeletal: Warm, no edema   CNS: Alert and oriented  Data Reviewed: Basic Metabolic Panel:  Recent Labs Lab 02/12/15 1357 02/12/15 1541 02/13/15 0539 02/14/15 0341  NA 141 141 138 138  K 4.2 4.1 3.9 3.9  CL 107 106 109 109  CO2 23  --  22 22  GLUCOSE 144* 136* 132* 112*  BUN 37* 38* 33* 23*  CREATININE 1.71* 1.50* 1.28* 1.18*  CALCIUM 8.8*  --  7.7* 7.7*   Liver Function Tests:  Recent Labs Lab 02/12/15 1357  AST 51*  ALT 19  ALKPHOS 50  BILITOT 1.0  PROT 6.5  ALBUMIN 3.3*   No results for input(s): LIPASE, AMYLASE in the last 168 hours. No results for input(s): AMMONIA in the last 168 hours. CBC:  Recent Labs Lab 02/12/15 1357 02/12/15 1541 02/13/15 0539 02/14/15 0341  WBC 12.9*  --  9.4 9.6  NEUTROABS 9.7*  --   --   --   HGB 11.6* 12.2 8.5* 9.7*  HCT 34.6* 36.0 25.3* 29.4*  MCV 89.4  --  90.4 87.5  PLT 221  --  157 133*   Cardiac Enzymes:  Recent Labs Lab 02/12/15 1357 02/13/15 0539 02/14/15 0341  CKTOTAL 1200* 891* 573*   BNP (last 3 results) No results for input(s): BNP in the last 8760 hours.  ProBNP (last 3 results) No results for input(s): PROBNP in the last 8760 hours.  CBG: No results for input(s): GLUCAP in the last 168 hours.  No results found for this or any previous visit (from the past 240 hour(s)).   Studies: Ct Head Wo Contrast  02/12/2015   CLINICAL DATA:  Found on floor this morning.  Fell last night. Left hip and neck pain. Patient on Coumadin. Hypertension.  EXAM: CT HEAD WITHOUT CONTRAST  CT CERVICAL SPINE WITHOUT CONTRAST  TECHNIQUE: Multidetector CT imaging of the head and cervical spine was performed following the standard protocol without intravenous contrast. Multiplanar CT image reconstructions of the cervical spine were also generated.  COMPARISON:  None.  FINDINGS: CT HEAD FINDINGS  There is atrophy and chronic small vessel disease changes. No acute intracranial abnormality. Specifically, no hemorrhage, hydrocephalus, mass lesion, acute infarction, or significant intracranial injury. No acute calvarial abnormality. Visualized paranasal sinuses and mastoids clear. Orbital soft tissues unremarkable.  CT CERVICAL SPINE FINDINGS  Normal alignment. Diffuse degenerative disc and facet disease throughout the cervical spine. Prevertebral soft tissues are normal. No fracture. No epidural or paraspinal hematoma.  IMPRESSION: No acute intracranial abnormality.  Atrophy, chronic microvascular disease.  Cervical spondylosis.  No acute bony abnormality.   Electronically Signed   By: Charlett Nose M.D.   On: 02/12/2015 14:56   Ct Cervical Spine Wo Contrast  02/12/2015   CLINICAL DATA:  Found on floor this morning. Fell last night. Left hip and neck pain. Patient on Coumadin. Hypertension.  EXAM: CT HEAD WITHOUT CONTRAST  CT CERVICAL SPINE WITHOUT CONTRAST  TECHNIQUE: Multidetector CT imaging of the head and cervical spine was performed following the standard protocol without intravenous contrast. Multiplanar CT image reconstructions of the cervical spine were also generated.  COMPARISON:  None.  FINDINGS: CT HEAD FINDINGS  There is atrophy and chronic small vessel disease changes. No acute intracranial abnormality. Specifically, no hemorrhage, hydrocephalus, mass lesion, acute infarction, or significant intracranial injury. No acute calvarial abnormality. Visualized paranasal sinuses and mastoids  clear. Orbital soft tissues unremarkable.  CT CERVICAL SPINE FINDINGS  Normal alignment. Diffuse degenerative disc and facet disease throughout the cervical spine. Prevertebral soft tissues are normal. No fracture. No epidural or paraspinal hematoma.  IMPRESSION: No acute intracranial abnormality.  Atrophy, chronic microvascular disease.  Cervical spondylosis.  No acute bony abnormality.   Electronically Signed   By: Charlett Nose M.D.   On: 02/12/2015 14:56   Dg Chest Port 1 View  02/12/2015   CLINICAL DATA:  81 year old female with history of fall yesterday evening found down on the floor this morning.  EXAM: PORTABLE CHEST - 1 VIEW  COMPARISON:  No priors.  FINDINGS: Lung volumes are normal. No consolidative airspace disease. No pleural effusions. Diffuse peribronchial cuffing. Mild cephalization of the pulmonary vasculature. Mild indistinctness of interstitial markings. Mild cardiomegaly. Slight prominence of upper mediastinal contours may be projectional. Atherosclerosis in the thoracic aorta.  IMPRESSION: 1. The appearance the chest suggests mild congestive heart failure. 2. Atherosclerosis.   Electronically Signed   By: Trudie Reed M.D.   On: 02/12/2015 14:23   Dg Hip Unilat With Pelvis 2-3 Views Left  02/12/2015   CLINICAL DATA:  Fall last night with left hip pain and left leg numbness.  EXAM: DG HIP (WITH OR WITHOUT PELVIS) 2-3V LEFT  COMPARISON:  None.  FINDINGS: Exam demonstrates moderate diffuse decreased bone mineralization. There are mild symmetric degenerative changes of the hips. Compression screw and lateral fixation plate is present over the right femur intact. There is a displaced intertrochanteric fracture of the left proximal femur with mild exaggerated coxa vara deformity. Moderate posterior displacement of the distal fragment.  There are degenerative changes of the spine and sacroiliac joints. Atherosclerotic plaque is present over the femoral arteries.  IMPRESSION: Displaced  intertrochanteric fracture of the left femur.   Electronically Signed   By: Elberta Fortis M.D.   On: 02/12/2015 14:43   Dg Femur 1v Left  02/12/2015   CLINICAL DATA:  Pt. Fell last night - found by postman this AM. Pain left hip and numbness left leg.  EXAM: LEFT FEMUR 1 VIEW  COMPARISON:  None.  FINDINGS: Study includes the proximal femoral shaft through the knee. The hip is not visualized on this exam.  No fracture or bone lesion on the included field of view. Knee joint is normally aligned with medial joint space compartment narrowing.  Bones are demineralized.  There are vascular calcifications medially.  IMPRESSION: No fracture or acute finding on the limited imaging acquired.   Electronically Signed   By: Amie Portland M.D.   On: 02/12/2015 14:39    Scheduled Meds: . sodium chloride   Intravenous Once  . sodium chloride   Intravenous Once  . cefTRIAXone (ROCEPHIN)  IV  1 g Intravenous Q24H  . fentaNYL (SUBLIMAZE) injection  50 mcg Intravenous Once  . [START ON 02/15/2015] Influenza vac split quadrivalent PF  0.5 mL Intramuscular Tomorrow-1000  . ondansetron (ZOFRAN) IV  4 mg Intravenous Once   Continuous Infusions: . sodium chloride 100 mL/hr at 02/14/15 0510      Time spent: 25 MINUTES    [name]  [facility] Pager  [phone]. If 7PM-7AM, please contact night-coverage at [email or web address], password [email or web address] 02/14/2015, 9:31 AM  LOS: 2 days

## 2015-02-14 NOTE — Progress Notes (Signed)
  Echocardiogram 2D Echocardiogram has been performed.  Arvil Chaco 02/14/2015, 10:08 AM

## 2015-02-14 NOTE — Progress Notes (Signed)
Received call from OR stating that patient was not going to have surgery today and that she is okay to resume diet. Surgery is rescheduled for tomorrow 8/30.

## 2015-02-14 NOTE — Clinical Documentation Improvement (Signed)
Hospitalist  Can the diagnosis of anemia be further specified?   Acute blood loss anemia   Iron deficiency Anemia  Chronic Anemia, including the suspected or known cause  Other  Clinically Undetermined  Supporting Information: -- Intertrochanteric fracture s/p fall in anticoagulated patient -- "Significant drop in H&H since admission" -- Treated with 2u PRBC  Please exercise your independent, professional judgment when responding. A specific answer is not anticipated or expected.   Thank You,  Beverley Fiedler CD[employer] [place] [phone]

## 2015-02-14 NOTE — Progress Notes (Signed)
Hgb 9.7 today INR 1.49  Needs IM nail L hip when medically ready Hold coumadin  Cont NPO OR today early afternoon

## 2015-02-14 NOTE — Progress Notes (Signed)
Initial Nutrition Assessment  DOCUMENTATION CODES:   Not applicable  INTERVENTION:   Post surgery: provide Ensure Enlive po BID, each supplement provides 350 kcal and 20 grams of protein.  NUTRITION DIAGNOSIS:   Increased nutrient needs related to  (surgery) as evidenced by estimated needs.  GOAL:   Patient will meet greater than or equal to 90% of their needs  MONITOR:   PO intake, Supplement acceptance, Weight trends, Labs, I & O's  REASON FOR ASSESSMENT:   Consult Assessment of nutrition requirement/status  ASSESSMENT:   81 y/o female with HTN, on coumadin for ?DVT, independent for ADLs and uses walker for ambulation presented to the ED after sustaining a fall at home with left hip fracture. She also had elevated CPK and acute kidney injury.  Pt reports having a good appetite currently and PTA. Meal completion has been 50%. Plans for surgery in the afternoon. Weight has been stable with usual body weight reported to be ~125 lbs. Pt is agreeable to Ensure post surgery. RD to order. Pt with no observed significant fat or muscle mass loss.   Labs: Low calcium and GFR. High BUN and creatinine.   Diet Order:  Diet regular Room service appropriate?: Yes; Fluid consistency:: Thin  Skin:   (Non-pitting LE edema)  Last BM:  8/26  Height:   Ht Readings from Last 1 Encounters:  02/12/15 5' (1.524 m)    Weight:   Wt Readings from Last 1 Encounters:  02/12/15 125 lb (56.7 kg)    Ideal Body Weight:  45.45 kg  BMI:  Body mass index is 24.41 kg/(m^2).  Estimated Nutritional Needs:   Kcal:  1400-1600  Protein:  60-70 grams   Fluid:  >/= 1.5 L/day  EDUCATION NEEDS:   No education needs identified at this time  Roslyn Smiling, MS, RD, LDN Pager # [phone] After hours/ weekend pager # [phone]

## 2015-02-15 ENCOUNTER — Inpatient Hospital Stay (HOSPITAL_COMMUNITY): Payer: Medicare Other | Admitting: Anesthesiology

## 2015-02-15 ENCOUNTER — Encounter (HOSPITAL_COMMUNITY)
Admission: EM | Disposition: A | Discharge status: Skilled Nursing Facility | Payer: Self-pay | Source: Home / Self Care | Attending: Internal Medicine

## 2015-02-15 ENCOUNTER — Inpatient Hospital Stay (HOSPITAL_COMMUNITY): Payer: Medicare Other

## 2015-02-15 DIAGNOSIS — S72142A Displaced intertrochanteric fracture of left femur, initial encounter for closed fracture: Secondary | ICD-10-CM | POA: Diagnosis present

## 2015-02-15 HISTORY — PX: INTRAMEDULLARY (IM) NAIL INTERTROCHANTERIC: SHX5875

## 2015-02-15 LAB — CBC
HCT: 28.1 % — ABNORMAL LOW (ref 36.0–46.0)
Hemoglobin: 9.2 g/dL — ABNORMAL LOW (ref 12.0–15.0)
MCH: 29.1 pg (ref 26.0–34.0)
MCHC: 32.7 g/dL (ref 30.0–36.0)
MCV: 88.9 fL (ref 78.0–100.0)
PLATELETS: 138 10*3/uL — AB (ref 150–400)
RBC: 3.16 MIL/uL — ABNORMAL LOW (ref 3.87–5.11)
RDW: 15.3 % (ref 11.5–15.5)
WBC: 9.3 10*3/uL (ref 4.0–10.5)

## 2015-02-15 LAB — HEPARIN LEVEL (UNFRACTIONATED): HEPARIN UNFRACTIONATED: 0.27 [IU]/mL — AB (ref 0.30–0.70)

## 2015-02-15 SURGERY — FIXATION, FRACTURE, INTERTROCHANTERIC, WITH INTRAMEDULLARY ROD
Anesthesia: General | Site: Hip | Laterality: Left

## 2015-02-15 MED ORDER — ROCURONIUM BROMIDE 100 MG/10ML IV SOLN
INTRAVENOUS | Status: DC | PRN
  Administered 2015-02-15: 10 mg via INTRAVENOUS
  Administered 2015-02-15: 30 mg via INTRAVENOUS

## 2015-02-15 MED ORDER — HYDROMORPHONE HCL 1 MG/ML IJ SOLN
0.2500 mg | INTRAMUSCULAR | Status: DC | PRN
  Administered 2015-02-15 (×2): 0.25 mg via INTRAVENOUS

## 2015-02-15 MED ORDER — ACETAMINOPHEN 325 MG PO TABS
650.0000 mg | ORAL_TABLET | Freq: Four times a day (QID) | ORAL | Status: DC | PRN
  Administered 2015-02-16: 650 mg via ORAL
  Filled 2015-02-15: qty 2

## 2015-02-15 MED ORDER — CLINDAMYCIN PHOSPHATE 600 MG/50ML IV SOLN
INTRAVENOUS | Status: AC
  Administered 2015-02-15: 600 mg via INTRAVENOUS
  Filled 2015-02-15: qty 50

## 2015-02-15 MED ORDER — ROCURONIUM BROMIDE 50 MG/5ML IV SOLN
INTRAVENOUS | Status: AC
  Filled 2015-02-15: qty 1

## 2015-02-15 MED ORDER — ACETAMINOPHEN 650 MG RE SUPP: 650.0000 mg | Freq: Four times a day (QID) | RECTAL | Status: DC | PRN

## 2015-02-15 MED ORDER — ONDANSETRON HCL 4 MG/2ML IJ SOLN: 4.0000 mg | Freq: Four times a day (QID) | INTRAMUSCULAR | Status: DC | PRN

## 2015-02-15 MED ORDER — WARFARIN 1.25 MG HALF TABLET: 1.2500 mg | ORAL_TABLET | ORAL | Status: DC

## 2015-02-15 MED ORDER — PROMETHAZINE HCL 25 MG/ML IJ SOLN
6.2500 mg | INTRAMUSCULAR | Status: DC | PRN
Start: 2015-02-15 — End: 2015-02-15

## 2015-02-15 MED ORDER — VALSARTAN-HYDROCHLOROTHIAZIDE 160-12.5 MG PO TABS: 0.5000 | ORAL_TABLET | Freq: Every day | ORAL | Status: DC

## 2015-02-15 MED ORDER — WARFARIN SODIUM 5 MG PO TABS
2.5000 mg | ORAL_TABLET | Freq: Once | ORAL | Status: AC
  Administered 2015-02-15: 2.5 mg via ORAL
  Filled 2015-02-15: qty 1

## 2015-02-15 MED ORDER — MENTHOL 3 MG MT LOZG
1.0000 | LOZENGE | OROMUCOSAL | Status: DC | PRN
Start: 2015-02-15 — End: 2015-02-19

## 2015-02-15 MED ORDER — WARFARIN 1.25 MG HALF TABLET: 1.2500 mg | ORAL_TABLET | Freq: Every day | ORAL | Status: DC

## 2015-02-15 MED ORDER — PHENOL 1.4 % MT LIQD: 1.0000 | OROMUCOSAL | Status: DC | PRN

## 2015-02-15 MED ORDER — LIDOCAINE HCL (CARDIAC) 20 MG/ML IV SOLN
INTRAVENOUS | Status: DC | PRN
  Administered 2015-02-15: 80 mg via INTRAVENOUS

## 2015-02-15 MED ORDER — IRBESARTAN 150 MG PO TABS
150.0000 mg | ORAL_TABLET | Freq: Every day | ORAL | Status: DC
  Administered 2015-02-15 – 2015-02-16 (×2): 150 mg via ORAL
  Filled 2015-02-15 (×2): qty 1

## 2015-02-15 MED ORDER — GLYCOPYRROLATE 0.2 MG/ML IJ SOLN
INTRAMUSCULAR | Status: DC | PRN
  Administered 2015-02-15: 0.4 mg via INTRAVENOUS

## 2015-02-15 MED ORDER — LACTATED RINGERS IV SOLN
INTRAVENOUS | Status: DC
  Administered 2015-02-15: 15:00:00 via INTRAVENOUS

## 2015-02-15 MED ORDER — WARFARIN SODIUM 5 MG PO TABS: 2.5000 mg | ORAL_TABLET | ORAL | Status: DC

## 2015-02-15 MED ORDER — HEPARIN (PORCINE) IN NACL 100-0.45 UNIT/ML-% IJ SOLN
1200.0000 [IU]/h | INTRAMUSCULAR | Status: DC
  Administered 2015-02-16: 1000 [IU]/h via INTRAVENOUS
  Administered 2015-02-17: 1100 [IU]/h via INTRAVENOUS
  Administered 2015-02-18: 1200 [IU]/h via INTRAVENOUS
  Filled 2015-02-15 (×4): qty 250

## 2015-02-15 MED ORDER — WARFARIN - PHYSICIAN DOSING INPATIENT: Freq: Every day | Status: DC

## 2015-02-15 MED ORDER — LABETALOL HCL 5 MG/ML IV SOLN
INTRAVENOUS | Status: DC | PRN
  Administered 2015-02-15: 10 mg via INTRAVENOUS

## 2015-02-15 MED ORDER — ONDANSETRON HCL 4 MG PO TABS: 4.0000 mg | ORAL_TABLET | Freq: Four times a day (QID) | ORAL | Status: DC | PRN

## 2015-02-15 MED ORDER — LIDOCAINE HCL (CARDIAC) 20 MG/ML IV SOLN
INTRAVENOUS | Status: AC
  Filled 2015-02-15: qty 5

## 2015-02-15 MED ORDER — HYDROCHLOROTHIAZIDE 12.5 MG PO CAPS
12.5000 mg | ORAL_CAPSULE | Freq: Every day | ORAL | Status: DC
  Administered 2015-02-15 – 2015-02-16 (×2): 12.5 mg via ORAL
  Filled 2015-02-15 (×2): qty 1

## 2015-02-15 MED ORDER — MEPERIDINE HCL 25 MG/ML IJ SOLN: 6.2500 mg | INTRAMUSCULAR | Status: DC | PRN

## 2015-02-15 MED ORDER — ONDANSETRON HCL 4 MG/2ML IJ SOLN
INTRAMUSCULAR | Status: DC | PRN
  Administered 2015-02-15: 4 mg via INTRAVENOUS

## 2015-02-15 MED ORDER — WARFARIN - PHARMACIST DOSING INPATIENT
Freq: Every day | Status: DC
  Administered 2015-02-16 – 2015-02-18 (×3)

## 2015-02-15 MED ORDER — SIMVASTATIN 20 MG PO TABS
20.0000 mg | ORAL_TABLET | Freq: Every day | ORAL | Status: DC
  Administered 2015-02-16 – 2015-02-18 (×3): 20 mg via ORAL
  Filled 2015-02-15 (×3): qty 1

## 2015-02-15 MED ORDER — CLINDAMYCIN PHOSPHATE 600 MG/50ML IV SOLN
600.0000 mg | Freq: Four times a day (QID) | INTRAVENOUS | Status: AC
  Administered 2015-02-15 – 2015-02-16 (×2): 600 mg via INTRAVENOUS
  Filled 2015-02-15 (×2): qty 50

## 2015-02-15 MED ORDER — PROPOFOL 10 MG/ML IV BOLUS
INTRAVENOUS | Status: AC
  Filled 2015-02-15: qty 20

## 2015-02-15 MED ORDER — NEOSTIGMINE METHYLSULFATE 10 MG/10ML IV SOLN
INTRAVENOUS | Status: DC | PRN
  Administered 2015-02-15: 3 mg via INTRAVENOUS

## 2015-02-15 MED ORDER — FENTANYL CITRATE (PF) 250 MCG/5ML IJ SOLN
INTRAMUSCULAR | Status: AC
  Filled 2015-02-15: qty 5

## 2015-02-15 MED ORDER — METOCLOPRAMIDE HCL 5 MG PO TABS: 5.0000 mg | ORAL_TABLET | Freq: Three times a day (TID) | ORAL | Status: DC | PRN

## 2015-02-15 MED ORDER — HYDROMORPHONE HCL 1 MG/ML IJ SOLN
INTRAMUSCULAR | Status: AC
  Filled 2015-02-15: qty 1

## 2015-02-15 MED ORDER — LACTATED RINGERS IV SOLN
INTRAVENOUS | Status: DC | PRN
  Administered 2015-02-15: 17:00:00 via INTRAVENOUS

## 2015-02-15 MED ORDER — 0.9 % SODIUM CHLORIDE (POUR BTL) OPTIME
TOPICAL | Status: DC | PRN
  Administered 2015-02-15: 1000 mL

## 2015-02-15 MED ORDER — SODIUM CHLORIDE 0.9 % IV SOLN
INTRAVENOUS | Status: DC | PRN
  Administered 2015-02-15: 19:00:00 via INTRAVENOUS

## 2015-02-15 MED ORDER — METOCLOPRAMIDE HCL 5 MG/ML IJ SOLN: 5.0000 mg | Freq: Three times a day (TID) | INTRAMUSCULAR | Status: DC | PRN

## 2015-02-15 MED ORDER — EPHEDRINE SULFATE 50 MG/ML IJ SOLN
INTRAMUSCULAR | Status: DC | PRN
  Administered 2015-02-15: 5 mg via INTRAVENOUS

## 2015-02-15 MED ORDER — FENTANYL CITRATE (PF) 100 MCG/2ML IJ SOLN
INTRAMUSCULAR | Status: DC | PRN
  Administered 2015-02-15: 100 ug via INTRAVENOUS
  Administered 2015-02-15 (×2): 50 ug via INTRAVENOUS

## 2015-02-15 MED ORDER — PROPOFOL 10 MG/ML IV BOLUS
INTRAVENOUS | Status: DC | PRN
  Administered 2015-02-15: 100 mg via INTRAVENOUS

## 2015-02-15 SURGICAL SUPPLY — 52 items
ALCOHOL ISOPROPYL (RUBBING) (MISCELLANEOUS) ×3 IMPLANT
BIT DRILL SHORT 4.2 (BIT) ×1 IMPLANT
BLADE HELICAL TFNA 90 HIP (Anchor) ×2 IMPLANT
BLADE HELICAL TFNA 90MM HIP (Anchor) ×1 IMPLANT
BNDG COHESIVE 4X5 TAN STRL (GAUZE/BANDAGES/DRESSINGS) IMPLANT
CANISTER SUCTION 2500CC (MISCELLANEOUS) ×3 IMPLANT
CHLORAPREP W/TINT 26ML (MISCELLANEOUS) ×3 IMPLANT
COVER PERINEAL POST (MISCELLANEOUS) ×3 IMPLANT
COVER SURGICAL LIGHT HANDLE (MISCELLANEOUS) ×3 IMPLANT
DERMABOND ADHESIVE PROPEN (GAUZE/BANDAGES/DRESSINGS) ×6
DERMABOND ADVANCED (GAUZE/BANDAGES/DRESSINGS) ×2
DERMABOND ADVANCED .7 DNX12 (GAUZE/BANDAGES/DRESSINGS) ×1 IMPLANT
DERMABOND ADVANCED .7 DNX6 (GAUZE/BANDAGES/DRESSINGS) ×3 IMPLANT
DRAPE C-ARM 42X72 X-RAY (DRAPES) ×3 IMPLANT
DRAPE C-ARMOR (DRAPES) ×3 IMPLANT
DRAPE IMP U-DRAPE 54X76 (DRAPES) ×6 IMPLANT
DRAPE STERI IOBAN 125X83 (DRAPES) ×3 IMPLANT
DRAPE U-SHAPE 47X51 STRL (DRAPES) ×6 IMPLANT
DRAPE UNIVERSAL PACK (DRAPES) ×3 IMPLANT
DRILL BIT SHORT 4.2 (BIT) ×2
DRSG MEPILEX BORDER 4X4 (GAUZE/BANDAGES/DRESSINGS) ×6 IMPLANT
DRSG MEPILEX BORDER 4X8 (GAUZE/BANDAGES/DRESSINGS) ×3 IMPLANT
DRSG PAD ABDOMINAL 8X10 ST (GAUZE/BANDAGES/DRESSINGS) IMPLANT
ELECT REM PT RETURN 9FT ADLT (ELECTROSURGICAL) ×3
ELECTRODE REM PT RTRN 9FT ADLT (ELECTROSURGICAL) ×1 IMPLANT
FACESHIELD WRAPAROUND (MASK) ×6 IMPLANT
GLOVE BIOGEL PI IND STRL 8.5 (GLOVE) ×2 IMPLANT
GLOVE BIOGEL PI INDICATOR 8.5 (GLOVE) ×4
GLOVE SURG ORTHO 8.5 STRL (GLOVE) ×3 IMPLANT
GOWN STRL REUS W/ TWL LRG LVL3 (GOWN DISPOSABLE) ×2 IMPLANT
GOWN STRL REUS W/TWL 2XL LVL3 (GOWN DISPOSABLE) ×3 IMPLANT
GOWN STRL REUS W/TWL LRG LVL3 (GOWN DISPOSABLE) ×4
GUIDEWIRE 3.2X400 (WIRE) ×3 IMPLANT
KIT ROOM TURNOVER OR (KITS) ×3 IMPLANT
MANIFOLD NEPTUNE II (INSTRUMENTS) IMPLANT
NAIL TROCH FIX LNG 11X340LT (Nail) ×3 IMPLANT
NS IRRIG 1000ML POUR BTL (IV SOLUTION) ×3 IMPLANT
PACK GENERAL/GYN (CUSTOM PROCEDURE TRAY) ×3 IMPLANT
PAD ARMBOARD 7.5X6 YLW CONV (MISCELLANEOUS) ×6 IMPLANT
PADDING CAST ABS 4INX4YD NS (CAST SUPPLIES)
PADDING CAST ABS COTTON 4X4 ST (CAST SUPPLIES) IMPLANT
REAMER ROD DEEP FLUTE 2.5X950 (INSTRUMENTS) ×3 IMPLANT
SCREW LOCK STAR 5X38 (Screw) ×3 IMPLANT
SUT MNCRL AB 3-0 PS2 18 (SUTURE) ×6 IMPLANT
SUT MNCRL AB 3-0 PS2 27 (SUTURE) ×3 IMPLANT
SUT MON AB 2-0 CT1 27 (SUTURE) IMPLANT
SUT VIC AB 1 CT1 27 (SUTURE) ×4
SUT VIC AB 1 CT1 27XBRD ANBCTR (SUTURE) ×2 IMPLANT
SUT VIC AB 2-0 CT1 27 (SUTURE) ×4
SUT VIC AB 2-0 CT1 TAPERPNT 27 (SUTURE) ×2 IMPLANT
TOWEL OR 17X24 6PK STRL BLUE (TOWEL DISPOSABLE) ×3 IMPLANT
TOWEL OR 17X26 10 PK STRL BLUE (TOWEL DISPOSABLE) ×3 IMPLANT

## 2015-02-15 NOTE — H&P (View-Only) (Signed)
Bridget Pratt is an 81 y.o. female.    Chief Complaint: left hip pain  HPI: 81 y/o female with ground level fall earlier today walking to mail box. Pt found by postman earlier this morning. Mild pain with movement of right upper extremity otherwise denies any other injuries. Pt unsure of how she fell but alert and appropriate today.   PCP:  No primary care provider on file.  PMH: Past Medical History  Diagnosis Date  . Hypertension     PSH: No past surgical history on file.  Social History:  reports that she has never smoked. She does not have any smokeless tobacco history on file. She reports that she does not drink alcohol or use illicit drugs.  Allergies:  Allergies  Allergen Reactions  . Penicillins Hives    Medications: Current Facility-Administered Medications  Medication Dose Route Frequency Provider Last Rate Last Dose  . fentaNYL (SUBLIMAZE) injection 50 mcg  50 mcg Intravenous Once Margarita Mail, PA-C   Stopped at 02/12/15 1415  . ondansetron (ZOFRAN) injection 4 mg  4 mg Intravenous Once Margarita Mail, PA-C   Stopped at 02/12/15 1415   No current outpatient prescriptions on file.    Results for orders placed or performed during the hospital encounter of 02/12/15 (from the past 48 hour(s))  Type and screen     Status: None   Collection Time: 02/12/15  1:31 PM  Result Value Ref Range   ABO/RH(D) A NEG    Antibody Screen NEG    Sample Expiration 02/15/2015   CBC WITH DIFFERENTIAL     Status: Abnormal   Collection Time: 02/12/15  1:57 PM  Result Value Ref Range   WBC 12.9 (H) 4.0 - 10.5 K/uL   RBC 3.87 3.87 - 5.11 MIL/uL   Hemoglobin 11.6 (L) 12.0 - 15.0 g/dL   HCT 34.6 (L) 36.0 - 46.0 %   MCV 89.4 78.0 - 100.0 fL   MCH 30.0 26.0 - 34.0 pg   MCHC 33.5 30.0 - 36.0 g/dL   RDW 14.7 11.5 - 15.5 %   Platelets 221 150 - 400 K/uL   Neutrophils Relative % 75 43 - 77 %   Neutro Abs 9.7 (H) 1.7 - 7.7 K/uL   Lymphocytes Relative 13 12 - 46 %   Lymphs Abs 1.6  0.7 - 4.0 K/uL   Monocytes Relative 12 3 - 12 %   Monocytes Absolute 1.5 (H) 0.1 - 1.0 K/uL   Eosinophils Relative 0 0 - 5 %   Eosinophils Absolute 0.0 0.0 - 0.7 K/uL   Basophils Relative 0 0 - 1 %   Basophils Absolute 0.0 0.0 - 0.1 K/uL  Protime-INR     Status: Abnormal   Collection Time: 02/12/15  1:57 PM  Result Value Ref Range   Prothrombin Time 24.9 (H) 11.6 - 15.2 seconds   INR 2.28 (H) 0.00 - 1.49  Comprehensive metabolic panel     Status: Abnormal   Collection Time: 02/12/15  1:57 PM  Result Value Ref Range   Sodium 141 135 - 145 mmol/L   Potassium 4.2 3.5 - 5.1 mmol/L   Chloride 107 101 - 111 mmol/L   CO2 23 22 - 32 mmol/L   Glucose, Bld 144 (H) 65 - 99 mg/dL   BUN 37 (H) 6 - 20 mg/dL   Creatinine, Ser 1.71 (H) 0.44 - 1.00 mg/dL   Calcium 8.8 (L) 8.9 - 10.3 mg/dL   Total Protein 6.5 6.5 - 8.1 g/dL   Albumin  3.3 (L) 3.5 - 5.0 g/dL   AST 51 (H) 15 - 41 U/L   ALT 19 14 - 54 U/L   Alkaline Phosphatase 50 38 - 126 U/L   Total Bilirubin 1.0 0.3 - 1.2 mg/dL   GFR calc non Af Amer 25 (L) >60 mL/min   GFR calc Af Amer 29 (L) >60 mL/min    Comment: (NOTE) The eGFR has been calculated using the CKD EPI equation. This calculation has not been validated in all clinical situations. eGFR's persistently <60 mL/min signify possible Chronic Kidney Disease.    Anion gap 11 5 - 15  CK     Status: Abnormal   Collection Time: 02/12/15  1:57 PM  Result Value Ref Range   Total CK 1200 (H) 38 - 234 U/L  I-stat chem 8, ed     Status: Abnormal   Collection Time: 02/12/15  3:41 PM  Result Value Ref Range   Sodium 141 135 - 145 mmol/L   Potassium 4.1 3.5 - 5.1 mmol/L   Chloride 106 101 - 111 mmol/L   BUN 38 (H) 6 - 20 mg/dL   Creatinine, Ser 1.50 (H) 0.44 - 1.00 mg/dL   Glucose, Bld 136 (H) 65 - 99 mg/dL   Calcium, Ion 1.15 1.13 - 1.30 mmol/L   TCO2 22 0 - 100 mmol/L   Hemoglobin 12.2 12.0 - 15.0 g/dL   HCT 36.0 36.0 - 46.0 %   Ct Head Wo Contrast  02/12/2015   CLINICAL DATA:   Found on floor this morning. Fell last night. Left hip and neck pain. Patient on Coumadin. Hypertension.  EXAM: CT HEAD WITHOUT CONTRAST  CT CERVICAL SPINE WITHOUT CONTRAST  TECHNIQUE: Multidetector CT imaging of the head and cervical spine was performed following the standard protocol without intravenous contrast. Multiplanar CT image reconstructions of the cervical spine were also generated.  COMPARISON:  None.  FINDINGS: CT HEAD FINDINGS  There is atrophy and chronic small vessel disease changes. No acute intracranial abnormality. Specifically, no hemorrhage, hydrocephalus, mass lesion, acute infarction, or significant intracranial injury. No acute calvarial abnormality. Visualized paranasal sinuses and mastoids clear. Orbital soft tissues unremarkable.  CT CERVICAL SPINE FINDINGS  Normal alignment. Diffuse degenerative disc and facet disease throughout the cervical spine. Prevertebral soft tissues are normal. No fracture. No epidural or paraspinal hematoma.  IMPRESSION: No acute intracranial abnormality.  Atrophy, chronic microvascular disease.  Cervical spondylosis.  No acute bony abnormality.   Electronically Signed   By: Rolm Baptise M.D.   On: 02/12/2015 14:56   Ct Cervical Spine Wo Contrast  02/12/2015   CLINICAL DATA:  Found on floor this morning. Fell last night. Left hip and neck pain. Patient on Coumadin. Hypertension.  EXAM: CT HEAD WITHOUT CONTRAST  CT CERVICAL SPINE WITHOUT CONTRAST  TECHNIQUE: Multidetector CT imaging of the head and cervical spine was performed following the standard protocol without intravenous contrast. Multiplanar CT image reconstructions of the cervical spine were also generated.  COMPARISON:  None.  FINDINGS: CT HEAD FINDINGS  There is atrophy and chronic small vessel disease changes. No acute intracranial abnormality. Specifically, no hemorrhage, hydrocephalus, mass lesion, acute infarction, or significant intracranial injury. No acute calvarial abnormality. Visualized  paranasal sinuses and mastoids clear. Orbital soft tissues unremarkable.  CT CERVICAL SPINE FINDINGS  Normal alignment. Diffuse degenerative disc and facet disease throughout the cervical spine. Prevertebral soft tissues are normal. No fracture. No epidural or paraspinal hematoma.  IMPRESSION: No acute intracranial abnormality.  Atrophy, chronic microvascular disease.  Cervical spondylosis.  No acute bony abnormality.   Electronically Signed   By: Rolm Baptise M.D.   On: 02/12/2015 14:56   Dg Chest Port 1 View  02/12/2015   CLINICAL DATA:  81 year old female with history of fall yesterday evening found down on the floor this morning.  EXAM: PORTABLE CHEST - 1 VIEW  COMPARISON:  No priors.  FINDINGS: Lung volumes are normal. No consolidative airspace disease. No pleural effusions. Diffuse peribronchial cuffing. Mild cephalization of the pulmonary vasculature. Mild indistinctness of interstitial markings. Mild cardiomegaly. Slight prominence of upper mediastinal contours may be projectional. Atherosclerosis in the thoracic aorta.  IMPRESSION: 1. The appearance the chest suggests mild congestive heart failure. 2. Atherosclerosis.   Electronically Signed   By: Vinnie Langton M.D.   On: 02/12/2015 14:23   Dg Hip Unilat With Pelvis 2-3 Views Left  02/12/2015   CLINICAL DATA:  Fall last night with left hip pain and left leg numbness.  EXAM: DG HIP (WITH OR WITHOUT PELVIS) 2-3V LEFT  COMPARISON:  None.  FINDINGS: Exam demonstrates moderate diffuse decreased bone mineralization. There are mild symmetric degenerative changes of the hips. Compression screw and lateral fixation plate is present over the right femur intact. There is a displaced intertrochanteric fracture of the left proximal femur with mild exaggerated coxa vara deformity. Moderate posterior displacement of the distal fragment.  There are degenerative changes of the spine and sacroiliac joints. Atherosclerotic plaque is present over the femoral arteries.   IMPRESSION: Displaced intertrochanteric fracture of the left femur.   Electronically Signed   By: Marin Olp M.D.   On: 02/12/2015 14:43   Dg Femur 1v Left  02/12/2015   CLINICAL DATA:  Pt. Fell last night - found by postman this AM. Pain left hip and numbness left leg.  EXAM: LEFT FEMUR 1 VIEW  COMPARISON:  None.  FINDINGS: Study includes the proximal femoral shaft through the knee. The hip is not visualized on this exam.  No fracture or bone lesion on the included field of view. Knee joint is normally aligned with medial joint space compartment narrowing.  Bones are demineralized.  There are vascular calcifications medially.  IMPRESSION: No fracture or acute finding on the limited imaging acquired.   Electronically Signed   By: Lajean Manes M.D.   On: 02/12/2015 14:39    ROS: ROS Lives at home Currently unable to ambulate due to recent fall  Physical Exam: Alert and appropriate 81 y/o female in no acute distress Cervical spine with full rom and no tenderness Right upper extremity: full rom with mild pain but no deformity or crepitus Left upper extremity with full rom no tenderness Left pelvis tender with mild shortening/external rotation of left lower leg Right lower extremity with full rom, no tenderness or deformity Previous right hip fracture scar from 4-5 years  Physical Exam   Assessment/Plan Assessment: left intertroch femur fracture  Plan: Medical team to admit and plan for surgical management of the left femur once INR is appropriate Recommend oral vitamin K today Plan for surgery once INR improves, likely Monday NPO for now Strict non weight bearing foley

## 2015-02-15 NOTE — Anesthesia Postprocedure Evaluation (Signed)
Anesthesia Post Note  Patient: Bridget Pratt  Procedure(s) Performed: Procedure(s) (LRB): INTRAMEDULLARY (IM) NAIL INTERTROCHANTRIC (Left)  Anesthesia type: general  Patient location: PACU  Post pain: Pain level controlled  Post assessment: Patient's Cardiovascular Status Stable  Last Vitals:  Filed Vitals:   02/15/15 2016  BP:   Pulse: 57  Temp:   Resp: 19    Post vital signs: Reviewed and stable  Level of consciousness: sedated  Complications: No apparent anesthesia complications

## 2015-02-15 NOTE — Anesthesia Preprocedure Evaluation (Addendum)
Anesthesia Evaluation  Patient identified by MRN, date of birth, ID band Patient awake    Reviewed: Allergy & Precautions, NPO status , Patient's Chart, lab work & pertinent test results  Airway Mallampati: II  TM Distance: >3 FB Neck ROM: Full    Dental no notable dental hx.    Pulmonary neg pulmonary ROS,  breath sounds clear to auscultation  Pulmonary exam normal       Cardiovascular hypertension, Pt. on medications Normal cardiovascular exam+ dysrhythmias Atrial Fibrillation + Valvular Problems/Murmurs AS Rhythm:Regular Rate:Normal  Echo 8/29 - Left ventricle: The cavity size was normal. Wall thickness wasnormal. Systolic function was normal. The estimated ejectionfraction was in the range of 50% to 55%. There is hypokinesis ofthe inferior myocardium. Doppler parameters are consistent withabnormal left ventricular relaxation (grade 1 diastolicdysfunction). - Aortic valve: There was mild to moderate stenosis. There was mildregurgitation. Peak velocity (S): 291 cm/s. Mean gradient (S): 19mm Hg. Valve area (VTI): 1.56 cm^2. Valve area (Vmax): 1.65 cm^2.Valve area (Vmean): 1.66 cm^2. - Mitral valve: Moderately calcified annulus. There was mildregurgitation. - Left atrium: The atrium was moderately dilated. - Pulmonary arteries: Systolic pressure was mildly to moderatelyincreased. PA peak pressure: 45 mm Hg (S).    Neuro/Psych  Neuromuscular disease negative psych ROS   GI/Hepatic negative GI ROS, Neg liver ROS,   Endo/Other  negative endocrine ROS  Renal/GU CRFRenal disease     Musculoskeletal negative musculoskeletal ROS (+)   Abdominal   Peds  Hematology negative hematology ROS (+)   Anesthesia Other Findings   Reproductive/Obstetrics negative OB ROS                          Anesthesia Physical Anesthesia Plan  ASA: IV  Anesthesia Plan: General   Post-op Pain Management:     Induction: Intravenous  Airway Management Planned: Oral ETT  Additional Equipment: None  Intra-op Plan:   Post-operative Plan: Extubation in OR  Informed Consent: I have reviewed the patients History and Physical, chart, labs and discussed the procedure including the risks, benefits and alternatives for the proposed anesthesia with the patient or authorized representative who has indicated his/her understanding and acceptance.   Dental advisory given  Plan Discussed with: CRNA and Surgeon  Anesthesia Plan Comments:       Anesthesia Quick Evaluation

## 2015-02-15 NOTE — Brief Op Note (Signed)
02/12/2015 - 02/15/2015  7:09 PM  PATIENT:  Bridget Pratt  81 y.o. female  PRE-OPERATIVE DIAGNOSIS:  left intertrochanteric femur fracture  POST-OPERATIVE DIAGNOSIS:  left intertrochanteric femur fracture  PROCEDURE:  Procedure(s): INTRAMEDULLARY (IM) NAIL INTERTROCHANTRIC (Left)  SURGEON:  Surgeon(s) and Role:    * Samson Frederic, MD - Primary  PHYSICIAN ASSISTANT: none.  ASSISTANTS: staff.   ANESTHESIA:   general  EBL:  Total I/O In: 600 [I.V.:600] Out: -   BLOOD ADMINISTERED:1 unit PRBCs.  DRAINS: none   LOCAL MEDICATIONS USED:  NONE  SPECIMEN:  No Specimen  DISPOSITION OF SPECIMEN:  N/A  COUNTS:  YES  TOURNIQUET:  * No tourniquets in log *  DICTATION: .Other Dictation: Dictation Number [id number]  PLAN OF CARE: Admit to inpatient   PATIENT DISPOSITION:  PACU - hemodynamically stable.   Delay start of Pharmacological VTE agent (>24hrs) due to surgical blood loss or risk of bleeding: not applicable

## 2015-02-15 NOTE — Progress Notes (Addendum)
[facility] PROGRESS NOTE  Bridget Pratt ZOX:[id number] DOB: 1920/06/04 DOA: 02/12/2015 PCP: Bridget Specking., MD  Brief narrative: 81 y/o female with HTN, on coumadin for afib and CVA ( had lt MCA stroke in 06),  independent for ADLs and uses walker for ambulation presented to the ED after sustaining a fall at home with left hip fracture. She also had elevated CPK and acute kidney injury.   Assessment/Plan: Closed left hip fracture Secondary to mechanical fall. Pain control with when necessary Vicodin and low-dose morphine. Bowel regimen . Foley placed on admission. Holding Coumadin for surgery.  -INR reversed with vitamin K.  2-D echo with EF of 50-55% and grade 1 diastolic dysfn. No further preoperative cardiac w/up necessary. For OR today.  Acute kidney injury Secondary to dehydration and patient on HCTZ-valsartan. Blood pressure medications held.. Improved with IV fluids  Acute rhabdomyolysis Secondary to fall and being on the floor for several hours. Patient also on statin which has been discontinued. Improved with IV fluids. LFTs normal.  Fever on 8/28 MAXIMUM TEMPERATURE of 100.4. Admission chest x-ray unremarkable but UA suggests a possible UTI.  on empiric Rocephin ( 8/29--)  Anemia Significant drop in H&H since admission. No baseline unknown.  Stool for occult blood pending. Improved with 2 units PRBC on 8/28. Last hb from 04/2014 ( as per PCP office was 13.4)  Hx of afib and stroke on coumadin At least since 2006. Spoke with PCP today regarding  drop in H&h. She will need coumadin post op for DVT prophylaxis anyway. He will monitor her h&H as outpt and if persistent drop will weigh on long term use fo anticoagulation.  Protein calorie malnutrition Nutrition consult.  DVT prophylaxis: IV heparin. Resume coumadin post op  Diet: NPO for OR  Code Staff: full code Family Communication: Spoke with son in [place]  (phone [phone]) Disposition Plan: Needs skilled  nursing facility postop. Possible discharge on 9/1or 9/2   Consult Orthopedics (Dr Veda Canning)  Procedures:  CT head and cervical spine   antibiotics None  Subjective Could not get surgery yesterday. Scheduled for today. Afebrile   Objective: Filed Vitals:   02/15/15 0705  BP: 143/46  Pulse: 63  Temp: 99.4 F (37.4 C)  Resp: 17    Intake/Output Summary (Last 24 hours) at 02/15/15 0947 Last data filed at 02/15/15 0706  Gross per 24 hour  Intake 314.83 ml  Output   1300 ml  Net -985.17 ml   Filed Weights   02/12/15 1333  Weight: 56.7 kg (125 lb)    Exam:   Elderly female in no acute distress, hard of hearing  HEENT:  moist oral mucosa  Chest: Clear to auscultation bilaterally  CVS: Normal S1 and S2, no murmurs rub or gallop  GI: Soft, nondistended, nontender, bowel sounds present , Foley placed on admission.  musculoskeletal: Warm, no edema   CNS: Alert and oriented  Data Reviewed: Basic Metabolic Panel:  Recent Labs Lab 02/12/15 1357 02/12/15 1541 02/13/15 0539 02/14/15 0341  NA 141 141 138 138  K 4.2 4.1 3.9 3.9  CL 107 106 109 109  CO2 23  --  22 22  GLUCOSE 144* 136* 132* 112*  BUN 37* 38* 33* 23*  CREATININE 1.71* 1.50* 1.28* 1.18*  CALCIUM 8.8*  --  7.7* 7.7*   Liver Function Tests:  Recent Labs Lab 02/12/15 1357  AST 51*  ALT 19  ALKPHOS 50  BILITOT 1.0  PROT 6.5  ALBUMIN 3.3*   No results for  input(s): LIPASE, AMYLASE in the last 168 hours. No results for input(s): AMMONIA in the last 168 hours. CBC:  Recent Labs Lab 02/12/15 1357 02/12/15 1541 02/13/15 0539 02/14/15 0341 02/15/15 0440  WBC 12.9*  --  9.4 9.6 9.3  NEUTROABS 9.7*  --   --   --   --   HGB 11.6* 12.2 8.5* 9.7* 9.2*  HCT 34.6* 36.0 25.3* 29.4* 28.1*  MCV 89.4  --  90.4 87.5 88.9  PLT 221  --  157 133* 138*   Cardiac Enzymes:  Recent Labs Lab 02/12/15 1357 02/13/15 0539 02/14/15 0341  CKTOTAL 1200* 891* 573*   BNP (last 3 results) No  results for input(s): BNP in the last 8760 hours.  ProBNP (last 3 results) No results for input(s): PROBNP in the last 8760 hours.  CBG: No results for input(s): GLUCAP in the last 168 hours.  Recent Results (from the past 240 hour(s))  Surgical pcr screen     Status: None   Collection Time: 02/14/15  8:50 PM  Result Value Ref Range Status   MRSA, PCR NEGATIVE NEGATIVE Final   Staphylococcus aureus NEGATIVE NEGATIVE Final    Comment:        The Xpert SA Assay (FDA approved for NASAL specimens in patients over 61 years of age), is one component of a comprehensive surveillance program.  Test performance has been validated by [facility] for patients greater than or equal to 53 year old. It is not intended to diagnose infection nor to guide or monitor treatment.      Studies: No results found.  Scheduled Meds: . sodium chloride   Intravenous Once  . sodium chloride   Intravenous Once  . cefTRIAXone (ROCEPHIN)  IV  1 g Intravenous Q24H  . feeding supplement (ENSURE ENLIVE)  237 mL Oral BID BM  . fentaNYL (SUBLIMAZE) injection  50 mcg Intravenous Once  . Influenza vac split quadrivalent PF  0.5 mL Intramuscular Tomorrow-1000  . ondansetron (ZOFRAN) IV  4 mg Intravenous Once   Continuous Infusions: . sodium chloride 100 mL/hr at 02/14/15 0510      Time spent: 25 MINUTES    [name]  [facility] Pager  [phone]. If 7PM-7AM, please contact night-coverage at [email or web address], password [facility] 02/15/2015, 9:47 AM  LOS: 3 days

## 2015-02-15 NOTE — Interval H&P Note (Signed)
History and Physical Interval Note:  02/15/2015 3:41 PM  Bridget Pratt  has presented today for surgery, with the diagnosis of left intertrochanter femur fracture  The various methods of treatment have been discussed with the patient and family. After consideration of risks, benefits and other options for treatment, the patient has consented to  Procedure(s): INTRAMEDULLARY (IM) NAIL INTERTROCHANTRIC (Left) as a surgical intervention .  The patient's history has been reviewed, patient examined, no change in status, stable for surgery.  I have reviewed the patient's chart and labs.  Questions were answered to the patient's satisfaction.     [name], [name]

## 2015-02-15 NOTE — Discharge Instructions (Signed)
You may weight bear as tolerated with a walker. Change dressings as needed. Keep incisions clean and dry.

## 2015-02-15 NOTE — Progress Notes (Signed)
ANTICOAGULATION CONSULT NOTE - Follow-up Consult  Pharmacy Consult for Heparin Indication: DVT  Allergies  Allergen Reactions  . Penicillins Hives    Patient Measurements: Height: 5' (152.4 cm) Weight: 125 lb (56.7 kg) IBW/kg (Calculated) : 45.5 Heparin Dosing Weight: 56.7 kg  Vital Signs: Temp: 98.5 F (36.9 C) (08/29 1942) Temp Source: Oral (08/29 1942) BP: 136/58 mmHg (08/29 1942) Pulse Rate: 62 (08/29 1942)  Labs:  Recent Labs  02/12/15 1357 02/12/15 1541 02/13/15 0539 02/14/15 0341 02/15/15 0440  HGB 11.6* 12.2 8.5* 9.7* 9.2*  HCT 34.6* 36.0 25.3* 29.4* 28.1*  PLT 221  --  157 133* 138*  LABPROT 24.9*  --  26.2* 18.1*  --   INR 2.28*  --  2.43* 1.49  --   HEPARINUNFRC  --   --   --   --  0.27*  CREATININE 1.71* 1.50* 1.28* 1.18*  --   CKTOTAL 1200*  --  891* 573*  --     Estimated Creatinine Clearance: 23.5 mL/min (by C-G formula based on Cr of 1.18).  Assessment: 81 year old female on PTA warfarin for DVT who presented to the ED after a mechanical fall. She was supposed to go to surgery today for left hip fracture but it has been postponed until tomorrow (02/15/15). Per Dr. Linna Caprice, start heparin drip tonight and then stop tomorrow morning at 0800. Heparin level 0.27 (slightly subtherapeutic) but heparin to be off in 2 hours for surgery so will not adjust.  Goal of Therapy:  Heparin level 0.3-0.7 units/ml Monitor platelets by anticoagulation protocol: Yes   Plan:  - Continue heparin at 1000 units/hr - stops at 0800 for surgery - Follow up plans for anticoagulation after surgery 02/15/15  Christoper Fabian, PharmD, BCPS Clinical pharmacist, pager [phone] 02/15/2015 5:50 AM

## 2015-02-15 NOTE — Progress Notes (Signed)
ANTICOAGULATION CONSULT NOTE - Follow-up Consult  Pharmacy Consult for Heparin/warfarin  Indication: DVT/AFib  Allergies  Allergen Reactions  . Penicillins Hives    Patient Measurements: Height: 5' (152.4 cm) Weight: 125 lb (56.7 kg) IBW/kg (Calculated) : 45.5 Heparin Dosing Weight: 56.7 kg  Vital Signs: Temp: 97.2 F (36.2 C) (08/30 2015) BP: 140/54 mmHg (08/30 2006) Pulse Rate: 57 (08/30 2016)  Labs:  Recent Labs  02/13/15 0539 02/14/15 0341 02/15/15 0440  HGB 8.5* 9.7* 9.2*  HCT 25.3* 29.4* 28.1*  PLT 157 133* 138*  LABPROT 26.2* 18.1*  --   INR 2.43* 1.49  --   HEPARINUNFRC  --   --  0.27*  CREATININE 1.28* 1.18*  --   CKTOTAL 891* 573*  --     Estimated Creatinine Clearance: 23.5 mL/min (by C-G formula based on Cr of 1.18).  Assessment: 81 year old female on PTA warfarin for hx of DVT who presented to the ED after a mechanical fall. POD #0 for closed L hip fracture. Will resume heparin 6 hours post-op. INR 1.49 yesterday.  Pt was previously on 1000 units/hr and was almost therapeutic. Pt received Vitamin K 5 mg x2 doses, it may take longer than normal for her INR to become therapeutic.  PTA warfarin: 1.25 mg on Mon/Wed/Fri, 2.5 mg all other days  Goal of Therapy:  Heparin level 0.3-0.7 units/ml Monitor platelets by anticoagulation protocol: Yes  INR 2-3   Plan:  -Warfarin 2.5 mg po x1 -Resume heparin at 1000 units/hr at 0130, withhold bolus given pt is immediately post-op -Daily INR, HL, CBC -Stop heparin when INR is therapeutic    Agapito Games, PharmD, BCPS Clinical Pharmacist Pager: [phone] 02/15/2015 8:47 PM

## 2015-02-15 NOTE — Anesthesia Procedure Notes (Signed)
Procedure Name: Intubation Date/Time: 02/15/2015 5:03 PM Performed by: Coralee Rud Pre-anesthesia Checklist: Patient identified, Emergency Drugs available, Suction available and Patient being monitored Patient Re-evaluated:Patient Re-evaluated prior to inductionOxygen Delivery Method: Circle system utilized Preoxygenation: Pre-oxygenation with 100% oxygen Intubation Type: IV induction Ventilation: Mask ventilation without difficulty Laryngoscope Size: Miller and 3 Grade View: Grade II Tube type: Oral Tube size: 7.0 mm Number of attempts: 1 Airway Equipment and Method: Stylet Placement Confirmation: positive ETCO2,  ETT inserted through vocal cords under direct vision and breath sounds checked- equal and bilateral Secured at: 21 cm Tube secured with: Tape Dental Injury: Teeth and Oropharynx as per pre-operative assessment

## 2015-02-15 NOTE — Transfer of Care (Signed)
Immediate Anesthesia Transfer of Care Note  Patient: Bridget Pratt  Procedure(s) Performed: Procedure(s): INTRAMEDULLARY (IM) NAIL INTERTROCHANTRIC (Left)  Patient Location: PACU  Anesthesia Type:General  Level of Consciousness: awake, alert  and patient cooperative  Airway & Oxygen Therapy: Patient connected to nasal cannula oxygen  Post-op Assessment: Report given to RN and Post -op Vital signs reviewed and stable  Post vital signs: Reviewed and stable  Last Vitals:  Filed Vitals:   02/15/15 1934  BP:   Pulse: 63  Temp: 36.6 C  Resp:     Complications: No apparent anesthesia complications

## 2015-02-16 ENCOUNTER — Encounter (HOSPITAL_COMMUNITY): Payer: Self-pay | Admitting: Orthopedic Surgery

## 2015-02-16 DIAGNOSIS — S7292XS Unspecified fracture of left femur, sequela: Secondary | ICD-10-CM

## 2015-02-16 DIAGNOSIS — S72142A Displaced intertrochanteric fracture of left femur, initial encounter for closed fracture: Secondary | ICD-10-CM | POA: Diagnosis not present

## 2015-02-16 DIAGNOSIS — E46 Unspecified protein-calorie malnutrition: Secondary | ICD-10-CM

## 2015-02-16 LAB — BASIC METABOLIC PANEL
Anion gap: 8 (ref 5–15)
BUN: 24 mg/dL — ABNORMAL HIGH (ref 6–20)
CALCIUM: 7.6 mg/dL — AB (ref 8.9–10.3)
CO2: 20 mmol/L — AB (ref 22–32)
CREATININE: 1.08 mg/dL — AB (ref 0.44–1.00)
Chloride: 109 mmol/L (ref 101–111)
GFR calc Af Amer: 50 mL/min — ABNORMAL LOW (ref >60)
GFR calc non Af Amer: 43 mL/min — ABNORMAL LOW (ref >60)
GLUCOSE: 131 mg/dL — AB (ref 65–99)
Potassium: 4.4 mmol/L (ref 3.5–5.1)
Sodium: 137 mmol/L (ref 135–145)

## 2015-02-16 LAB — TYPE AND SCREEN
ABO/RH(D): A NEG
Antibody Screen: NEGATIVE
UNIT DIVISION: 0
UNIT DIVISION: 0
UNIT DIVISION: 0
Unit division: 0
Unit division: 0
Unit division: 0

## 2015-02-16 LAB — HEPARIN LEVEL (UNFRACTIONATED): Heparin Unfractionated: 0.27 IU/mL — ABNORMAL LOW (ref 0.30–0.70)

## 2015-02-16 LAB — CBC
HEMATOCRIT: 29.4 % — AB (ref 36.0–46.0)
Hemoglobin: 9.6 g/dL — ABNORMAL LOW (ref 12.0–15.0)
MCH: 29 pg (ref 26.0–34.0)
MCHC: 32.7 g/dL (ref 30.0–36.0)
MCV: 88.8 fL (ref 78.0–100.0)
Platelets: 144 10*3/uL — ABNORMAL LOW (ref 150–400)
RBC: 3.31 MIL/uL — ABNORMAL LOW (ref 3.87–5.11)
RDW: 15.5 % (ref 11.5–15.5)
WBC: 9.4 10*3/uL (ref 4.0–10.5)

## 2015-02-16 LAB — PROTIME-INR
INR: 1.46 (ref 0.00–1.49)
Prothrombin Time: 17.8 seconds — ABNORMAL HIGH (ref 11.6–15.2)

## 2015-02-16 LAB — CK: CK TOTAL: 714 U/L — AB (ref 38–234)

## 2015-02-16 MED ORDER — WARFARIN SODIUM 4 MG PO TABS
4.0000 mg | ORAL_TABLET | Freq: Once | ORAL | Status: AC
  Administered 2015-02-16: 4 mg via ORAL
  Filled 2015-02-16: qty 1

## 2015-02-16 MED ORDER — HYDROCODONE-ACETAMINOPHEN 5-325 MG PO TABS: 1.0000 | ORAL_TABLET | Freq: Four times a day (QID) | ORAL | Status: AC | PRN

## 2015-02-16 MED ORDER — SODIUM CHLORIDE 0.9 % IV BOLUS (SEPSIS)
500.0000 mL | Freq: Once | INTRAVENOUS | Status: AC
  Administered 2015-02-16: 500 mL via INTRAVENOUS

## 2015-02-16 NOTE — Progress Notes (Addendum)
ANTICOAGULATION CONSULT NOTE - Follow-up Consult  Pharmacy Consult for Heparin/warfarin  Indication: DVT/AFib  Allergies  Allergen Reactions  . Penicillins Hives    Patient Measurements: Height: 5' (152.4 cm) Weight: 125 lb (56.7 kg) IBW/kg (Calculated) : 45.5 Heparin Dosing Weight: 56.7 kg  Vital Signs: Temp: 98.1 F (36.7 C) (08/31 0611) Temp Source: Oral (08/31 0611) BP: 127/47 mmHg (08/31 0611) Pulse Rate: 73 (08/31 0611)  Labs:  Recent Labs  02/14/15 0341 02/15/15 0440 02/16/15 0508  HGB 9.7* 9.2* 9.6*  HCT 29.4* 28.1* 29.4*  PLT 133* 138* 144*  LABPROT 18.1*  --  17.8*  INR 1.49  --  1.46  HEPARINUNFRC  --  0.27*  --   CREATININE 1.18*  --  1.08*  CKTOTAL 573*  --  714*    Estimated Creatinine Clearance: 25.7 mL/min (by C-G formula based on Cr of 1.08).  Assessment: 81 year old female on PTA warfarin for hx of DVT who presented to the ED after a mechanical fall. POD #1 for closed L hip fracture. Pt received Vitamin K 5 mg x2 doses, it may take longer than normal for her INR to become therapeutic.  PTA warfarin: 1.25 mg on Mon/Wed/Fri, 2.5 mg all other days  INR today = 1.46, HL = 0.27 CBC stable  Goal of Therapy:  Heparin level 0.3-0.7 units/ml Monitor platelets by anticoagulation protocol: Yes  INR 2-3   Plan:  Warfarin 4 mg po x1 Increase heparin to 1100 units / hr Follow up AM labs  Thank you Okey Regal, PharmD [phone]  02/16/2015 1:16 PM

## 2015-02-16 NOTE — Progress Notes (Signed)
Nutrition Follow-up  DOCUMENTATION CODES:   Not applicable  INTERVENTION:   Continue Ensure Enlive po BID, each supplement provides 350 kcal and 20 grams of protein.  Encourage adequate PO intake.   NUTRITION DIAGNOSIS:   Increased nutrient needs related to  (surgery) as evidenced by estimated needs; ongoing  GOAL:   Patient will meet greater than or equal to 90% of their needs; progressing  MONITOR:   PO intake, Supplement acceptance, Weight trends, Labs, I & O's  REASON FOR ASSESSMENT:   Consult Assessment of nutrition requirement/status  ASSESSMENT:   81 y/o female with HTN, on coumadin for ?DVT, independent for ADLs and uses walker for ambulation presented to the ED after sustaining a fall at home with left hip fracture. She also had elevated CPK and acute kidney injury.  PROCEDURE (8/30): INTRAMEDULLARY (IM) NAIL INTERTROCHANTRIC (Left)  Diet has just been advanced to a regular diet. Pt currently has Ensure ordered and has been consuming them. RD to continue with current orders. Pt encouraged to eat her food at meals and to drink her supplements.   Labs: Low CO2, calcium, and GFR. High BUN and creatinine.   Diet Order:  Diet regular Room service appropriate?: Yes; Fluid consistency:: Thin  Skin:   (Incision on L hip, non-pitting LE edema)  Last BM:  8/26  Height:   Ht Readings from Last 1 Encounters:  02/12/15 5' (1.524 m)    Weight:   Wt Readings from Last 1 Encounters:  02/12/15 125 lb (56.7 kg)    Ideal Body Weight:  45.45 kg  BMI:  Body mass index is 24.41 kg/(m^2).  Estimated Nutritional Needs:   Kcal:  1400-1600  Protein:  60-70 grams   Fluid:  >/= 1.5 L/day  EDUCATION NEEDS:   No education needs identified at this time  Roslyn Smiling, MS, RD, LDN Pager # [phone] After hours/ weekend pager # [phone]

## 2015-02-16 NOTE — Progress Notes (Signed)
Physical Therapy Treatment Patient Details Name: Revecca Nachtigal MRN: [id number] DOB: [birth year] Today's Date: 02/24/15    History of Present Illness Pt s/p L hip fx with IM nail repair    PT Comments    Pt motivated but moving slowly - ltd by pain and fatigue  Follow Up Recommendations  SNF     Equipment Recommendations  None recommended by PT    Recommendations for Other Services OT consult     Precautions / Restrictions Precautions Precautions: Fall Restrictions Weight Bearing Restrictions: No LLE Weight Bearing: Weight bearing as tolerated    Mobility  Bed Mobility Overal bed mobility: Needs Assistance;+2 for physical assistance Bed Mobility: Supine to Sit;Sit to Supine     Supine to sit: Mod assist;+2 for physical assistance;HOB elevated Sit to supine: Max assist;+2 for physical assistance;+2 for safety/equipment   General bed mobility comments: Cues for sequence and use of R LE to self assist.  Pt requiring physical assist to manage L LE and to control trunk .  Transfers                 General transfer comment: NT - pt tolerated sitting EOB x 6 min with slowly improving R lean  Ambulation/Gait                 Stairs            Wheelchair Mobility    Modified Rankin (Stroke Patients Only)       Balance                                    Cognition Arousal/Alertness: Awake/alert Behavior During Therapy: WFL for tasks assessed/performed Overall Cognitive Status: Within Functional Limits for tasks assessed                      Exercises Total Joint Exercises Ankle Circles/Pumps: AROM;Both;15 reps;Supine Heel Slides: AAROM;Left;10 reps;Supine Hip ABduction/ADduction: AAROM;Left;10 reps;Supine    General Comments        Pertinent Vitals/Pain Pain Assessment: 0-10 Pain Score: 6  Pain Location: L hip Pain Descriptors / Indicators: Aching;Sore Pain Intervention(s): Limited activity within  patient's tolerance;Monitored during session;Premedicated before session;Ice applied    Home Living Family/patient expects to be discharged to:: Skilled nursing facility Living Arrangements: Alone                  Prior Function Level of Independence: Independent with assistive device(s)          PT Goals (current goals can now be found in the care plan section) Acute Rehab PT Goals Patient Stated Goal: I want to walk PT Goal Formulation: With patient Time For Goal Achievement: 02/24/15 Potential to Achieve Goals: Fair Progress towards PT goals: Progressing toward goals    Frequency  Min 3X/week    PT Plan Current plan remains appropriate    Co-evaluation             End of Session Equipment Utilized During Treatment: Gait belt Activity Tolerance: No increased pain;Patient limited by fatigue Patient left: in bed;with call bell/phone within reach     Time: 1113-1137 PT Time Calculation (min) (ACUTE ONLY): 24 min  Charges:  $Therapeutic Exercise: 8-22 mins $Therapeutic Activity: 8-22 mins                    G Codes:      Neomi Laidler 02-24-15, 12:59 PM

## 2015-02-16 NOTE — Evaluation (Signed)
Physical Therapy Evaluation Patient Details Name: Taleeyah Bora MRN: [id number] DOB: [birth year] Today's Date: 02/16/2015   History of Present Illness  Pt s/p L hip fx with IM nail repair  Clinical Impression  Pt presents s/p IM nailing of L hip fx with decreased L LE strength/ROM, post op pain and generalized weakness/deconditioning limiting functional mobility.  Pt would greatly benefit from follow up rehab at SNF level to maximize IND and safety prior to dc home with very limited assist.    Follow Up Recommendations SNF    Equipment Recommendations  None recommended by PT    Recommendations for Other Services OT consult     Precautions / Restrictions Precautions Precautions: Fall Restrictions Weight Bearing Restrictions: No LLE Weight Bearing: Weight bearing as tolerated      Mobility  Bed Mobility               General bed mobility comments: Deferred 2* pain  Transfers                 General transfer comment: Deferred 2* pain  Ambulation/Gait                Stairs            Wheelchair Mobility    Modified Rankin (Stroke Patients Only)       Balance                                             Pertinent Vitals/Pain Pain Assessment: 0-10 Pain Score: 9  Pain Location: L hip Pain Descriptors / Indicators: Aching;Sore Pain Intervention(s): Limited activity within patient's tolerance;Monitored during session;Premedicated before session;Patient requesting pain meds-RN notified    Home Living Family/patient expects to be discharged to:: Skilled nursing facility Living Arrangements: Alone                    Prior Function Level of Independence: Independent with assistive device(s)               Hand Dominance        Extremity/Trunk Assessment   Upper Extremity Assessment: Generalized weakness           Lower Extremity Assessment: LLE deficits/detail;RLE deficits/detail RLE Deficits /  Details: Generalized weakness with ROM WFL    Cervical / Trunk Assessment: Kyphotic  Communication   Communication: HOH  Cognition Arousal/Alertness: Awake/alert Behavior During Therapy: WFL for tasks assessed/performed Overall Cognitive Status: Within Functional Limits for tasks assessed                      General Comments      Exercises        Assessment/Plan    PT Assessment Patient needs continued PT services  PT Diagnosis Difficulty walking   PT Problem List Decreased strength;Decreased range of motion;Decreased activity tolerance;Decreased balance;Decreased mobility;Decreased knowledge of use of DME;Pain  PT Treatment Interventions DME instruction;Gait training;Functional mobility training;Therapeutic activities;Therapeutic exercise;Patient/family education   PT Goals (Current goals can be found in the Care Plan section) Acute Rehab PT Goals Patient Stated Goal: I want to walk PT Goal Formulation: With patient Time For Goal Achievement: 02/24/15 Potential to Achieve Goals: Fair    Frequency Min 3X/week   Barriers to discharge Decreased caregiver support Pt lives alone    Co-evaluation  End of Session                 Time: 1610-9604 PT Time Calculation (min) (ACUTE ONLY): 18 min   Charges:   PT Evaluation $Initial PT Evaluation Tier I: 1 Procedure     PT G Codes:        Iyanla Eilers 02/24/15, 12:49 PM

## 2015-02-16 NOTE — Clinical Social Work Note (Signed)
Clinical Social Work Assessment  Patient Details  Name: Bridget Pratt MRN: [id number] Date of Birth: March 10, 1920  Date of referral:  02/16/15               Reason for consult:  Facility Placement, Discharge Planning                Permission sought to share information with:  Facility Medical sales representative, Family Supports Permission granted to share information::  Yes, Verbal Permission Granted  Name::     Nikitta Sobiech  Agency::  [facility]  Relationship::  Son / Armed forces technical officer Information:  [phone]  Housing/Transportation Living arrangements for the past 2 months:  Single Family Home Source of Information:  Patient, Adult Children Patient Interpreter Needed:  None Criminal Activity/Legal Involvement Pertinent to Current Situation/Hospitalization:  No - Comment as needed Significant Relationships:  Adult Children Lives with:  Self Do you feel safe going back to the place where you live?  No (High fall risk.) Need for family participation in patient care:  Yes (Comment) (Patient defers decision making to son, Hulan Fess)  Care giving concerns:  Patient nor patient's son expressed any concerns at this time.   Social Worker assessment / plan:  CSW received referral for possible SNF placement at time of discharge. CSW attempted to meet with patient at bedside, however, patient requesting CSW speak with patient's son Hulan Fess) regarding discharge disposition. CSW contacted patient's son, who informed CSW that patient and patient's family would prefer placement at [facility]. CSW to continue to follow and assist with discharge planning needs.  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  Patient/Family's Understanding of and Emotional Response to Diagnosis, Current Treatment, and Prognosis:  Patient and patient's family understanding and agreeable to CSW plan of care.  Emotional Assessment Appearance:  Appears stated age Attitude/Demeanor/Rapport:  Other (Hard of hearing.) Affect (typically observed):  Calm, Quiet, Pleasant Orientation:  Oriented to Self, Oriented to Place, Oriented to  Time, Oriented to Situation Alcohol / Substance use:  Not Applicable Psych involvement (Current and /or in the community):  No (Comment) (Not appropriate on this admission.)  Discharge Needs  Concerns to be addressed:  No discharge needs identified Readmission within the last 30 days:  No Current discharge risk:  None Barriers to Discharge:  No Barriers Identified   Rod Mae, LCSW 02/16/2015, 3:21 PM [phone]

## 2015-02-16 NOTE — Progress Notes (Signed)
   Subjective:  Patient reports pain as mild to moderate.  Denies N/V/CP/SOB.  Objective:   VITALS:   Filed Vitals:   02/15/15 2016 02/15/15 2229 02/16/15 0031 02/16/15 0611  BP:  118/49 99/42 127/47  Pulse: 57 62 63 73  Temp:  98.9 F (37.2 C) 99.5 F (37.5 C) 98.1 F (36.7 C)  TempSrc:  Oral Oral Oral  Resp: 19     Height:      Weight:      SpO2: 96% 98% 98% 98%    ABD soft Sensation intact distally Intact pulses distally Dorsiflexion/Plantar flexion intact Incision: dressing C/D/I Compartment soft   Lab Results  Component Value Date   WBC 9.4 02/16/2015   HGB 9.6* 02/16/2015   HCT 29.4* 02/16/2015   MCV 88.8 02/16/2015   PLT 144* 02/16/2015   BMET    Component Value Date/Time   NA 137 02/16/2015 0508   K 4.4 02/16/2015 0508   CL 109 02/16/2015 0508   CO2 20* 02/16/2015 0508   GLUCOSE 131* 02/16/2015 0508   BUN 24* 02/16/2015 0508   CREATININE 1.08* 02/16/2015 0508   CALCIUM 7.6* 02/16/2015 0508   GFRNONAA 43* 02/16/2015 0508   GFRAA 50* 02/16/2015 0508    INR 1.46  Assessment/Plan: 1 Day Post-Op   Principal Problem:   Closed left hip fracture Active Problems:   Acute kidney injury   Hypotension   Anticoagulated on Coumadin   Rhabdomyolysis   Blood glucose elevated   Protein calorie malnutrition   Femur fracture   Intertrochanteric fracture of left femur   WBAT LLE with walker IV abx x23 hrs PT/OT UTI per primary team DVT ppx: coumadin, heparin gtt resumed last night - d/c when INR > 2.0 Dispo: D/C planning   Ivorie Uplinger, [name] 02/16/2015, 7:33 AM   Samson Frederic, MD Cell [phone]

## 2015-02-16 NOTE — Progress Notes (Signed)
Patient's BP 90/36 at 1700. BP 110/36 at 1943.  Dropped since Vicodin given at 1117.  Baseline 110's-120's/40's-50's. MD paged. Will raise awareness for on coming RN during report.

## 2015-02-16 NOTE — Progress Notes (Signed)
[facility] PROGRESS NOTE  Bridget Pratt ZOX:[id number] DOB: March 09, 1920 DOA: 02/12/2015 PCP: Ignatius Specking., MD  Brief narrative: 81 y/o female with HTN, on coumadin for afib and CVA ( had lt MCA stroke in 06),  independent for ADLs and uses walker for ambulation presented to the ED after sustaining a fall at home with left hip fracture. She also had elevated CPK and acute kidney injury.   Assessment/Plan: Closed left hip fracture Secondary to mechanical fall. Pain control with when necessary Vicodin and low-dose morphine. Bowel regimen . Foley placed on admission. Coumadin held for surgery.   - INR reversed with vitamin K.  2-D echo with EF of 50-55% and grade 1 diastolic dysfn. No further preoperative cardiac w/up necessary. - 1 day post op. Ortho on board  Acute kidney injury Improved with IV fluids  Acute rhabdomyolysis Secondary to fall and being on the floor for several hours. Patient also on statin which has been discontinued.  - Improved with IV fluids.  Fever on 8/28 No fever within the last 24 hours. Admission chest x-ray unremarkable but UA suggests a possible UTI.   - Will continue Rocephin  Anemia Significant drop in H&H since admission. No baseline unknown.  Stool for occult blood pending. Improved with 2 units PRBC on 8/28. Last hb from 04/2014 ( as per PCP office was 13.4) - Patient has no active bleeding reported and on last check improved.  Hx of afib and stroke on coumadin At least since 2006. Spoke with PCP today regarding  drop in H&h. She will need coumadin post op for DVT prophylaxis anyway. He will monitor her h&H as outpt and if persistent drop will weigh on long term use of anticoagulation.  Protein calorie malnutrition Nutrition consult.  DVT prophylaxis: IV heparin. Resume coumadin post op  Diet: NPO for OR  Code Staff: full code Family Communication: Spoke with son in [place]  (phone [phone]) Disposition Plan: Needs skilled nursing  facility postop. Possible discharge on 9/1or 9/2   Consult Orthopedics (Dr Veda Canning)  Procedures:  CT head and cervical spine   antibiotics None  Subjective Pt has no new complaints. No acute issues overnight.   Objective: Filed Vitals:   02/16/15 0611  BP: 127/47  Pulse: 73  Temp: 98.1 F (36.7 C)  Resp:     Intake/Output Summary (Last 24 hours) at 02/16/15 1340 Last data filed at 02/16/15 0615  Gross per 24 hour  Intake   1481 ml  Output   1100 ml  Net    381 ml   Filed Weights   02/12/15 1333  Weight: 56.7 kg (125 lb)    Exam:   Elderly female in no acute distress, hard of hearing  HEENT:  moist oral mucosa, no masses on visual examination.  Chest: Clear to auscultation bilaterally, no wheezes  CVS: Normal S1 and S2, no murmurs rub or gallop  GI: Soft, nondistended, nontender, bowel sounds present , Foley placed on admission.  musculoskeletal: Warm, no edema   CNS: Alert and oriented  Data Reviewed: Basic Metabolic Panel:  Recent Labs Lab 02/12/15 1357 02/12/15 1541 02/13/15 0539 02/14/15 0341 02/16/15 0508  NA 141 141 138 138 137  K 4.2 4.1 3.9 3.9 4.4  CL 107 106 109 109 109  CO2 23  --  22 22 20*  GLUCOSE 144* 136* 132* 112* 131*  BUN 37* 38* 33* 23* 24*  CREATININE 1.71* 1.50* 1.28* 1.18* 1.08*  CALCIUM 8.8*  --  7.7* 7.7* 7.6*  Liver Function Tests:  Recent Labs Lab 02/12/15 1357  AST 51*  ALT 19  ALKPHOS 50  BILITOT 1.0  PROT 6.5  ALBUMIN 3.3*   No results for input(s): LIPASE, AMYLASE in the last 168 hours. No results for input(s): AMMONIA in the last 168 hours. CBC:  Recent Labs Lab 02/12/15 1357 02/12/15 1541 02/13/15 0539 02/14/15 0341 02/15/15 0440 02/16/15 0508  WBC 12.9*  --  9.4 9.6 9.3 9.4  NEUTROABS 9.7*  --   --   --   --   --   HGB 11.6* 12.2 8.5* 9.7* 9.2* 9.6*  HCT 34.6* 36.0 25.3* 29.4* 28.1* 29.4*  MCV 89.4  --  90.4 87.5 88.9 88.8  PLT 221  --  157 133* 138* 144*   Cardiac  Enzymes:  Recent Labs Lab 02/12/15 1357 02/13/15 0539 02/14/15 0341 02/16/15 0508  CKTOTAL 1200* 891* 573* 714*   BNP (last 3 results) No results for input(s): BNP in the last 8760 hours.  ProBNP (last 3 results) No results for input(s): PROBNP in the last 8760 hours.  CBG: No results for input(s): GLUCAP in the last 168 hours.  Recent Results (from the past 240 hour(s))  Surgical pcr screen     Status: None   Collection Time: 02/14/15  8:50 PM  Result Value Ref Range Status   MRSA, PCR NEGATIVE NEGATIVE Final   Staphylococcus aureus NEGATIVE NEGATIVE Final    Comment:        The Xpert SA Assay (FDA approved for NASAL specimens in patients over 49 years of age), is one component of a comprehensive surveillance program.  Test performance has been validated by [facility] for patients greater than or equal to 12 year old. It is not intended to diagnose infection nor to guide or monitor treatment.      Studies: Pelvis Portable  02/15/2015   CLINICAL DATA:  Evaluate internal fixation of left femoral fracture. Initial encounter.  EXAM: PORTABLE PELVIS 1-2 VIEWS  COMPARISON:  Intraoperative images performed earlier today at 6:42 p.m.  FINDINGS: The patient's intramedullary rod and screw at the left femur transfix the intertrochanteric fracture in grossly anatomic alignment. No new fractures are seen. A minimally displaced lesser trochanteric fragment is seen. Right femoral hardware appears grossly intact, without evidence of loosening.  Scattered vascular calcifications are seen.  IMPRESSION: Status post internal fixation of the patient's left femoral intertrochanteric fracture in grossly anatomic alignment. Minimally displaced lesser trochanteric fragment seen.   Electronically Signed   By: Roanna Raider M.D.   On: 02/15/2015 20:12   Dg C-arm 1-60 Min  02/15/2015   CLINICAL DATA:  Left intertrochanteric hip fracture fixation. Initial encounter.  EXAM: LEFT FEMUR 2 VIEWS; DG  C-ARM 61-120 MIN  COMPARISON:  Radiographs 02/12/2015.  FLUOROSCOPY TIME:  C-arm fluoroscopic images were obtained intraoperatively and submitted for post operative interpretation. Please see the performing provider's procedural report for the fluoroscopy time utilized.  FINDINGS: Four spot fluoroscopic images demonstrate fixation of the intertrochanteric left femur fracture with an intramedullary nail and interlocking distal screw. The main fracture fragments demonstrate improved alignment. No complications identified.  IMPRESSION: Intraoperative views during left femur ORIF. No demonstrated complication.   Electronically Signed   By: Carey Bullocks M.D.   On: 02/15/2015 19:14   Dg Femur Min 2 Views Left  02/15/2015   CLINICAL DATA:  Postoperative exam after left intertrochanteric femoral fracture fixation  EXAM: LEFT FEMUR 2 VIEWS  COMPARISON:  Intraoperative imaging same date  FINDINGS:  Fracture fragments are in near anatomic alignment after dynamic left femoral intra medullary nail fixation of the previously seen intertrochanteric fracture. No evidence for hardware failure. No new fracture line. Degenerative change noted at the knee.  IMPRESSION: Expected postoperative appearance after left dynamic nail femoral fixation.   Electronically Signed   By: Christiana Pellant M.D.   On: 02/15/2015 21:22   Dg Femur Min 2 Views Left  02/15/2015   CLINICAL DATA:  Left intertrochanteric hip fracture fixation. Initial encounter.  EXAM: LEFT FEMUR 2 VIEWS; DG C-ARM 61-120 MIN  COMPARISON:  Radiographs 02/12/2015.  FLUOROSCOPY TIME:  C-arm fluoroscopic images were obtained intraoperatively and submitted for post operative interpretation. Please see the performing provider's procedural report for the fluoroscopy time utilized.  FINDINGS: Four spot fluoroscopic images demonstrate fixation of the intertrochanteric left femur fracture with an intramedullary nail and interlocking distal screw. The main fracture fragments  demonstrate improved alignment. No complications identified.  IMPRESSION: Intraoperative views during left femur ORIF. No demonstrated complication.   Electronically Signed   By: Carey Bullocks M.D.   On: 02/15/2015 19:14    Scheduled Meds: . cefTRIAXone (ROCEPHIN)  IV  1 g Intravenous Q24H  . feeding supplement (ENSURE ENLIVE)  237 mL Oral BID BM  . irbesartan  150 mg Oral Daily   And  . hydrochlorothiazide  12.5 mg Oral Daily  . simvastatin  20 mg Oral q1800  . warfarin  4 mg Oral ONCE-1800  . Warfarin - Pharmacist Dosing Inpatient   Does not apply q1800   Continuous Infusions: . sodium chloride 100 mL/hr at 02/16/15 0504  . heparin 1,000 Units/hr (02/16/15 0125)    Time spent: 25 MINUTES   Vinay Ertl, [facility]  [facility] Pager  [phone] If 7PM-7AM, please contact night-coverage at [email or web address], password [facility] 02/16/2015, 1:40 PM  LOS: 4 days

## 2015-02-16 NOTE — Clinical Documentation Improvement (Signed)
Trauma Critical Care  Based on the clinical findings, please document any associated diagnoses/conditions the patient has or may have. Note  8/27 documents 8/24 CT results "brain edema".   Cerebral edema  Other  Clinically Undetermined  Please exercise your independent, professional judgment when responding. A specific answer is not anticipated or expected.   Thank You, Beverley Fiedler [employer] Information Management [place]

## 2015-02-16 NOTE — Evaluation (Signed)
Occupational Therapy Evaluation Patient Details Name: Bridget Pratt MRN: [id number] DOB: 1920/03/02 Today's Date: 02/16/2015    History of Present Illness Pt s/p L hip fx with IM nail repair   Clinical Impression   Pt presents s/p IM nailing of Lt hip fracture impacting her ability to participate in ADLs and IADLs.  Pt currently requires total to +2 assist with bed mobility.  Unable to assess self-care tasks due to pain and decreased sitting tolerance, with progressive Rt lean during unsupported sitting at EOB.  Due to weakness and deconditioning, pt would benefit from continued OT services and recommend SNF to maximize independence prior to D/C home with limited assistance.    Follow Up Recommendations  SNF    Equipment Recommendations  None recommended by OT (TBD at next venue of care)    Recommendations for Other Services       Precautions / Restrictions Precautions Precautions: Fall Restrictions Weight Bearing Restrictions: Yes LLE Weight Bearing: Weight bearing as tolerated      Mobility Bed Mobility Overal bed mobility: Needs Assistance;+2 for physical assistance Bed Mobility: Supine to Sit;Sit to Supine     Supine to sit: Mod assist;+2 for physical assistance;HOB elevated Sit to supine: Max assist;+2 for physical assistance;+2 for safety/equipment   General bed mobility comments: Cues for sequence and use of R LE to self assist as well as pushing up through RUE to sit at EOB.  Pt requiring physical assist to manage L LE and to control trunk.  Use of bed pad to assist with positioning to come to EOB.  Transfers                 General transfer comment: Unable to assess due to pain and decreased sitting tolerance         ADL Overall ADL's : Needs assistance/impaired                                     Functional mobility during ADLs: Total assistance General ADL Comments: Total assist to come to sitting at EOB, would most likely  require +2 with ADLs due to pain with all movements     Vision Vision Assessment?: No apparent visual deficits          Pertinent Vitals/Pain Pain Assessment: 0-10 Pain Score: 6  Pain Location: Lt hip Pain Descriptors / Indicators: Aching;Sore Pain Intervention(s): Limited activity within patient's tolerance;Monitored during session;Repositioned;Ice applied     Hand Dominance Right   Extremity/Trunk Assessment Upper Extremity Assessment Upper Extremity Assessment: Generalized weakness (Pt reports Rt shoulder pain from fall)       Cervical / Trunk Assessment Cervical / Trunk Assessment: Kyphotic   Communication Communication Communication: HOH   Cognition Arousal/Alertness: Awake/alert Behavior During Therapy: WFL for tasks assessed/performed Overall Cognitive Status: Within Functional Limits for tasks assessed                                Home Living Family/patient expects to be discharged to:: Skilled nursing facility Living Arrangements: Alone                               Additional Comments: Pt reports having a "girl" that assists as she needs, mostly driving her to appointments and grocery store.      Prior Functioning/Environment Level  of Independence: Independent with assistive device(s)             OT Diagnosis: Generalized weakness;Acute pain   OT Problem List: Decreased strength;Decreased range of motion;Decreased activity tolerance;Impaired balance (sitting and/or standing);Decreased safety awareness;Decreased knowledge of use of DME or AE;Decreased knowledge of precautions;Pain   OT Treatment/Interventions: Self-care/ADL training;Therapeutic exercise;Energy conservation;DME and/or AE instruction;Therapeutic activities;Patient/family education    OT Goals(Current goals can be found in the care plan section) Acute Rehab OT Goals OT Goal Formulation: With patient Time For Goal Achievement: 02/23/15 Potential to Achieve  Goals: Fair  OT Frequency: Min 1X/week   Barriers to D/C: Decreased caregiver support  limited assistance, reports having a "girl" who assists mostly with driving to appts and grocery store          End of Session Nurse Communication: Mobility status  Activity Tolerance: Patient limited by pain Patient left: in bed;with call bell/phone within reach;with bed alarm set   Time: 4098-1191 OT Time Calculation (min): 20 min Charges:  OT General Charges $OT Visit: 1 Procedure OT Evaluation $Initial OT Evaluation Tier I: 1 Procedure G-CodesRosalio Loud, [id number] 02/16/2015, 3:49 PM

## 2015-02-16 NOTE — Op Note (Signed)
[name], Bridget Pratt NO.:  [id number]  MEDICAL RECORD NO.:  [id number]  LOCATION:  [id number]                        FACILITY:  [facility]  PHYSICIAN:  Samson Frederic, MD     DATE OF BIRTH:  Dec 27, 1919  DATE OF PROCEDURE:  02/15/2015 DATE OF DISCHARGE:                              OPERATIVE REPORT   SURGEON:  Samson Frederic, MD.  ASSISTANT:  Staff.  PREOPERATIVE DIAGNOSIS:  Left pertrochanteric femur fracture.  POSTOPERATIVE DIAGNOSIS:  Left pertrochanteric femur fracture.  PROCEDURE PERFORMED:  Cephalomedullary nail fixation of left pertrochanteric femur fracture.  ANESTHESIA:  General.  ANTIBIOTICS:  900 mg of clindamycin due to penicillin allergy.  BLOOD PRODUCTS:  1 unit packed red blood cells.  SPECIMENS:  None.  COMPLICATIONS:  None.  DISPOSITION:  Stable to PACU.  IMPLANTS: 1. Synthes TFNA 11 x 340 mm nail, 130 degrees angle. 2. A 90-mm TFNA helical blade. 3. A 5.0-mm distal interlocking screw x1.  INDICATIONS:  The patient is a 81 year old female with multiple medical problems who had a mechanical ground level fall on February 13, 2015.  She came to the hospital with hip pain and inability to weight bear.  X-rays revealed a pertrochanteric femur fracture.  Her INR was found to be supratherapeutic.  She was admitted to the hospitalist service and underwent perioperative risk stratification and medical optimization. She was treated with oral vitamin K and her INR normalized.  Risks, benefits, and alternatives to the above-mentioned procedure were explained.  She elected to proceed.  DESCRIPTION OF PROCEDURE IN DETAIL:  The patient was correctly identified in the holding area using 2 identifiers.  Surgical site was marked by myself.  She was taken to the operating room.  General anesthesia was induced.  She was then placed on the Hana table.  The right lower extremity was scissored underneath the left.  An attempt at reduction of the fracture was  made using traction, internal rotation, and adduction.  The fracture was irreducible.  The left lower extremity was prepped and draped in normal sterile surgical fashion.  Time-out was called verifying side and site of surgery.  The patient did receive IV antibiotics within 60 minutes beginning the procedure.  I have made a standard 4 cm incision proximal to the tip of the trochanter.  I localized the starting point for the femoral nail.  I then turned my attention to the anticipated site of the lag screw.  I made approximately 5 cm incision which I carried down to the IT band.  I split the IT band in line with the fibers, and then I reflected the vastus anteriorly. I placed a bone hook subperiosteally up towards the femoral neck.  I engaged the femoral neck, and I percutaneously reduced the fracture.  This was checked in AP and lateral fluoroscopy views.  I then turned my attention getting the starting point.  It was evident that the greater trochanter was comminuted.  I then placed a guide pin down by hand using a standard trochanteric entry site in AP and lateral views.  I then used an entry reamer.  I placed a guidewire to the physeal scar of the knee.  I reamed sequentially up to  a 12.5 mm reamer with excellent chatter.  An 11-mm nail was selected and this was impacted into place.  I removed the guidewire through our distal incision.  I inserted the cannula down to the bone, and I placed a guide pin center-center using AP and lateral fluoroscopy views.  I then measured the length of the lag screw, and then I impacted the helical blade.  AP and lateral fluoroscopy views were used to confirm fracture reduction and hardware placement.  Tip-apex distance was appropriate. There was no chondral penetration.  I removed the jig after I tightened the set screw.  I turned my attention distally, and using the perfect circle technique, I placed 1 distal interlocking screw.  Final fluoroscopy  views were obtained biplanar.  The wounds were copiously irrigated with saline.  I closed the fascia with #1 interrupted Vicryl. The deep dermal tissue was closed with 2-0 interrupted Vicryl, and then there was a 3-0 running Monocryl subcuticular stitch.  Dermabond was applied to the skin once the glue was fully hardened.  Sterile dressings were applied.  The patient was transferred to the bed, extubated, and taken to the PACU in stable condition.  Sponge, needle, and instrument counts were correct at the end of the case x2.  There were no known complications.          ______________________________ Samson Frederic, MD     BS/MEDQ  D:  02/15/2015  T:  02/16/2015  Job:  [id number]

## 2015-02-17 DIAGNOSIS — S72142S Displaced intertrochanteric fracture of left femur, sequela: Secondary | ICD-10-CM

## 2015-02-17 DIAGNOSIS — S72002S Fracture of unspecified part of neck of left femur, sequela: Secondary | ICD-10-CM

## 2015-02-17 LAB — CBC
HEMATOCRIT: 28.5 % — AB (ref 36.0–46.0)
HEMOGLOBIN: 9.3 g/dL — AB (ref 12.0–15.0)
MCH: 29.4 pg (ref 26.0–34.0)
MCHC: 32.6 g/dL (ref 30.0–36.0)
MCV: 90.2 fL (ref 78.0–100.0)
Platelets: 153 10*3/uL (ref 150–400)
RBC: 3.16 MIL/uL — ABNORMAL LOW (ref 3.87–5.11)
RDW: 15.8 % — AB (ref 11.5–15.5)
WBC: 11.2 10*3/uL — ABNORMAL HIGH (ref 4.0–10.5)

## 2015-02-17 LAB — BASIC METABOLIC PANEL
Anion gap: 7 (ref 5–15)
BUN: 26 mg/dL — AB (ref 6–20)
CHLORIDE: 106 mmol/L (ref 101–111)
CO2: 21 mmol/L — AB (ref 22–32)
CREATININE: 1.18 mg/dL — AB (ref 0.44–1.00)
Calcium: 7.7 mg/dL — ABNORMAL LOW (ref 8.9–10.3)
GFR calc non Af Amer: 39 mL/min — ABNORMAL LOW (ref >60)
GFR, EST AFRICAN AMERICAN: 45 mL/min — AB (ref >60)
GLUCOSE: 139 mg/dL — AB (ref 65–99)
Potassium: 4.1 mmol/L (ref 3.5–5.1)
Sodium: 134 mmol/L — ABNORMAL LOW (ref 135–145)

## 2015-02-17 LAB — HEPARIN LEVEL (UNFRACTIONATED): Heparin Unfractionated: 0.52 IU/mL (ref 0.30–0.70)

## 2015-02-17 LAB — PROTIME-INR
INR: 1.72 — AB (ref 0.00–1.49)
Prothrombin Time: 20.2 seconds — ABNORMAL HIGH (ref 11.6–15.2)

## 2015-02-17 MED ORDER — WARFARIN SODIUM 5 MG PO TABS
2.5000 mg | ORAL_TABLET | Freq: Once | ORAL | Status: AC
  Administered 2015-02-17: 2.5 mg via ORAL
  Filled 2015-02-17: qty 1

## 2015-02-17 NOTE — Progress Notes (Signed)
Utilization review completed. Zahraa Bhargava, RN, BSN. 

## 2015-02-17 NOTE — Progress Notes (Signed)
Physical Therapy Treatment Patient Details Name: Ladaija Dimino MRN: [id number] DOB: [birth year] Today's Date: 02/17/2015    History of Present Illness Pt s/p L hip fx with IM nail repair    PT Comments    Patient progressing slowly with mobility. Biggest limiting factor is pain. Requires assist of 2 for bed mobility. Tolerated sitting EOB unsupported for 1-2 minutes however fatigues easily. Not appropriate to attempt standing today. Reviewed IS. Instructed pt in exercises. Very motivated. Will continue to follow acutely per current POC.   Follow Up Recommendations  SNF     Equipment Recommendations  None recommended by PT    Recommendations for Other Services       Precautions / Restrictions Precautions Precautions: Fall Restrictions Weight Bearing Restrictions: Yes LLE Weight Bearing: Weight bearing as tolerated    Mobility  Bed Mobility Overal bed mobility: Needs Assistance Bed Mobility: Supine to Sit     Supine to sit: +2 for physical assistance;HOB elevated;Max assist Sit to supine: Max assist;+2 for physical assistance   General bed mobility comments: Assist to bring LLE to EOB, elevate trunk and scoot bottom to EOB. Right lateral lean and difficulty coming to upright position due to pain through left hip. Use of bed pad to position hips. Increased pain.  Transfers                 General transfer comment: Unable to assess due to pain and decreased sitting tolerance  Ambulation/Gait                 Stairs            Wheelchair Mobility    Modified Rankin (Stroke Patients Only)       Balance Overall balance assessment: Needs assistance Sitting-balance support: Feet supported;Bilateral upper extremity supported Sitting balance-Leahy Scale: Poor Sitting balance - Comments: Requires UE support and Mod A for sitting balance with right lateral lean. Pt offloading left hip. Able to sit EOB ~10 minutes; unsupported for ~1-2  minutes. Postural control: Right lateral lean;Posterior lean                          Cognition Arousal/Alertness: Awake/alert Behavior During Therapy: WFL for tasks assessed/performed Overall Cognitive Status: Within Functional Limits for tasks assessed                      Exercises General Exercises - Lower Extremity Ankle Circles/Pumps: Both;15 reps;Supine Long Arc Quad: AAROM;Left;10 reps;Seated;Right    General Comments        Pertinent Vitals/Pain Pain Assessment: Faces Faces Pain Scale: Hurts worst Pain Location: left hip Pain Descriptors / Indicators: Aching;Sore;Grimacing;Guarding;Moaning Pain Intervention(s): Limited activity within patient's tolerance;Monitored during session;Repositioned;Ice applied    Home Living                      Prior Function            PT Goals (current goals can now be found in the care plan section) Progress towards PT goals: Progressing toward goals (slowly, limited by pain.)    Frequency  Min 3X/week    PT Plan Current plan remains appropriate    Co-evaluation             End of Session   Activity Tolerance: Patient limited by pain Patient left: in bed;with call bell/phone within reach;with bed alarm set;with SCD's reapplied     Time: 1050-1110 PT Time Calculation (min) (  ACUTE ONLY): 20 min  Charges:  $Therapeutic Activity: 8-22 mins                    G Codes:      Dragan Tamburrino A [place] 02/17/2015, 2:41 PM Mylo Red, PT, DPT [phone]

## 2015-02-17 NOTE — Progress Notes (Signed)
[facility] PROGRESS NOTE  Bridget Pratt UJW:[id number] DOB: 28-May-1920 DOA: 02/12/2015 PCP: Ignatius Specking., MD  Brief narrative: 81 y/o female with HTN, on coumadin for afib and CVA ( had lt MCA stroke in 06),  independent for ADLs and uses walker for ambulation presented to the ED after sustaining a fall at home with left hip fracture. She also had elevated CPK and acute kidney injury.   Assessment/Plan: Closed left hip fracture Secondary to mechanical fall. Pain control with when necessary Vicodin and low-dose morphine. Bowel regimen . Foley placed on admission. Coumadin held for surgery.   - INR reversed with vitamin K.  2-D echo with EF of 50-55% and grade 1 diastolic dysfn. No further preoperative cardiac w/up necessary. - 1 day post op. Ortho on board  Acute kidney injury Stable, mild increase in S creatinine  Acute rhabdomyolysis Secondary to fall and being on the floor for several hours. Patient also on statin which has been discontinued.  - Improved with IV fluids.  Fever on 8/28 No fever within the last 24 hours. Admission chest x-ray unremarkable but UA suggests a possible UTI.   - Will continue Rocephin  Anemia Significant drop in H&H since admission. No baseline unknown.  Stool for occult blood pending. Improved with 2 units PRBC on 8/28. Last hb from 04/2014 ( as per PCP office was 13.4) - Patient has no active bleeding reported and on last check improved.  Hx of afib and stroke on coumadin At least since 2006. Spoke with PCP today regarding  drop in H&h. She will need coumadin post op for DVT prophylaxis anyway. He will monitor her h&H as outpt and if persistent drop will weigh on long term use of anticoagulation.  Protein calorie malnutrition Nutrition consult.  DVT prophylaxis: IV heparin. Resume coumadin post op  Diet: NPO for OR  Code Staff: full code Family Communication: Spoke with son in [place]  (phone [phone]) Disposition Plan: Needs  skilled nursing facility postop. Possible discharge on 9/1or 9/2   Consult Orthopedics (Dr Veda Canning)  Procedures:  CT head and cervical spine   antibiotics None  Subjective Pt has no new complaints. No acute issues overnight.   Objective: Filed Vitals:   02/17/15 1300  BP: 116/39  Pulse: 77  Temp: 100.2 F (37.9 C)  Resp: 18    Intake/Output Summary (Last 24 hours) at 02/17/15 1538 Last data filed at 02/17/15 1300  Gross per 24 hour  Intake    240 ml  Output    300 ml  Net    -60 ml   Filed Weights   02/12/15 1333  Weight: 56.7 kg (125 lb)    Exam:   Elderly female in no acute distress, hard of hearing  HEENT:  moist oral mucosa, no masses on visual examination.  Chest: Clear to auscultation bilaterally, no wheezes  CVS: Normal S1 and S2, no murmurs rub or gallop  GI: Soft, nondistended, nontender, bowel sounds present , Foley placed on admission.  musculoskeletal: Warm, no edema   CNS: Alert and oriented  Data Reviewed: Basic Metabolic Panel:  Recent Labs Lab 02/12/15 1357 02/12/15 1541 02/13/15 0539 02/14/15 0341 02/16/15 0508 02/17/15 0558  NA 141 141 138 138 137 134*  K 4.2 4.1 3.9 3.9 4.4 4.1  CL 107 106 109 109 109 106  CO2 23  --  22 22 20* 21*  GLUCOSE 144* 136* 132* 112* 131* 139*  BUN 37* 38* 33* 23* 24* 26*  CREATININE 1.71* 1.50* 1.28*  1.18* 1.08* 1.18*  CALCIUM 8.8*  --  7.7* 7.7* 7.6* 7.7*   Liver Function Tests:  Recent Labs Lab 02/12/15 1357  AST 51*  ALT 19  ALKPHOS 50  BILITOT 1.0  PROT 6.5  ALBUMIN 3.3*   No results for input(s): LIPASE, AMYLASE in the last 168 hours. No results for input(s): AMMONIA in the last 168 hours. CBC:  Recent Labs Lab 02/12/15 1357  02/13/15 0539 02/14/15 0341 02/15/15 0440 02/16/15 0508 02/17/15 0558  WBC 12.9*  --  9.4 9.6 9.3 9.4 11.2*  NEUTROABS 9.7*  --   --   --   --   --   --   HGB 11.6*  < > 8.5* 9.7* 9.2* 9.6* 9.3*  HCT 34.6*  < > 25.3* 29.4* 28.1* 29.4* 28.5*   MCV 89.4  --  90.4 87.5 88.9 88.8 90.2  PLT 221  --  157 133* 138* 144* 153  < > = values in this interval not displayed. Cardiac Enzymes:  Recent Labs Lab 02/12/15 1357 02/13/15 0539 02/14/15 0341 02/16/15 0508  CKTOTAL 1200* 891* 573* 714*   BNP (last 3 results) No results for input(s): BNP in the last 8760 hours.  ProBNP (last 3 results) No results for input(s): PROBNP in the last 8760 hours.  CBG: No results for input(s): GLUCAP in the last 168 hours.  Recent Results (from the past 240 hour(s))  Surgical pcr screen     Status: None   Collection Time: 02/14/15  8:50 PM  Result Value Ref Range Status   MRSA, PCR NEGATIVE NEGATIVE Final   Staphylococcus aureus NEGATIVE NEGATIVE Final    Comment:        The Xpert SA Assay (FDA approved for NASAL specimens in patients over 44 years of age), is one component of a comprehensive surveillance program.  Test performance has been validated by [facility] for patients greater than or equal to 2 year old. It is not intended to diagnose infection nor to guide or monitor treatment.      Studies: Pelvis Portable  02/15/2015   CLINICAL DATA:  Evaluate internal fixation of left femoral fracture. Initial encounter.  EXAM: PORTABLE PELVIS 1-2 VIEWS  COMPARISON:  Intraoperative images performed earlier today at 6:42 p.m.  FINDINGS: The patient's intramedullary rod and screw at the left femur transfix the intertrochanteric fracture in grossly anatomic alignment. No new fractures are seen. A minimally displaced lesser trochanteric fragment is seen. Right femoral hardware appears grossly intact, without evidence of loosening.  Scattered vascular calcifications are seen.  IMPRESSION: Status post internal fixation of the patient's left femoral intertrochanteric fracture in grossly anatomic alignment. Minimally displaced lesser trochanteric fragment seen.   Electronically Signed   By: Roanna Raider M.D.   On: 02/15/2015 20:12   Dg C-arm  1-60 Min  02/15/2015   CLINICAL DATA:  Left intertrochanteric hip fracture fixation. Initial encounter.  EXAM: LEFT FEMUR 2 VIEWS; DG C-ARM 61-120 MIN  COMPARISON:  Radiographs 02/12/2015.  FLUOROSCOPY TIME:  C-arm fluoroscopic images were obtained intraoperatively and submitted for post operative interpretation. Please see the performing provider's procedural report for the fluoroscopy time utilized.  FINDINGS: Four spot fluoroscopic images demonstrate fixation of the intertrochanteric left femur fracture with an intramedullary nail and interlocking distal screw. The main fracture fragments demonstrate improved alignment. No complications identified.  IMPRESSION: Intraoperative views during left femur ORIF. No demonstrated complication.   Electronically Signed   By: Carey Bullocks M.D.   On: 02/15/2015 19:14  Dg Femur Min 2 Views Left  02/15/2015   CLINICAL DATA:  Postoperative exam after left intertrochanteric femoral fracture fixation  EXAM: LEFT FEMUR 2 VIEWS  COMPARISON:  Intraoperative imaging same date  FINDINGS: Fracture fragments are in near anatomic alignment after dynamic left femoral intra medullary nail fixation of the previously seen intertrochanteric fracture. No evidence for hardware failure. No new fracture line. Degenerative change noted at the knee.  IMPRESSION: Expected postoperative appearance after left dynamic nail femoral fixation.   Electronically Signed   By: Christiana Pellant M.D.   On: 02/15/2015 21:22   Dg Femur Min 2 Views Left  02/15/2015   CLINICAL DATA:  Left intertrochanteric hip fracture fixation. Initial encounter.  EXAM: LEFT FEMUR 2 VIEWS; DG C-ARM 61-120 MIN  COMPARISON:  Radiographs 02/12/2015.  FLUOROSCOPY TIME:  C-arm fluoroscopic images were obtained intraoperatively and submitted for post operative interpretation. Please see the performing provider's procedural report for the fluoroscopy time utilized.  FINDINGS: Four spot fluoroscopic images demonstrate fixation of  the intertrochanteric left femur fracture with an intramedullary nail and interlocking distal screw. The main fracture fragments demonstrate improved alignment. No complications identified.  IMPRESSION: Intraoperative views during left femur ORIF. No demonstrated complication.   Electronically Signed   By: Carey Bullocks M.D.   On: 02/15/2015 19:14    Scheduled Meds: . cefTRIAXone (ROCEPHIN)  IV  1 g Intravenous Q24H  . feeding supplement (ENSURE ENLIVE)  237 mL Oral BID BM  . simvastatin  20 mg Oral q1800  . warfarin  2.5 mg Oral ONCE-1800  . Warfarin - Pharmacist Dosing Inpatient   Does not apply q1800   Continuous Infusions: . sodium chloride 100 mL/hr at 02/17/15 0358  . heparin 1,100 Units/hr (02/16/15 1442)    Time spent: 25 MINUTES   Claudine Stallings, [facility]  [facility] Pager  [phone] If 7PM-7AM, please contact night-coverage at [email or web address], password [facility] 02/17/2015, 3:38 PM  LOS: 5 days

## 2015-02-17 NOTE — Progress Notes (Signed)
ANTICOAGULATION CONSULT NOTE - Follow-up Consult  Pharmacy Consult for Heparin/Warfarin  Indication: DVT/AFib  Allergies  Allergen Reactions  . Penicillins Hives   Assessment: 81 year old female on PTA warfarin for hx of DVT who presented to the ED after a mechanical fall. POD #1 for closed L hip fracture. Pt received Vitamin K 5 mg x2 doses, it may take longer than normal for her INR to become therapeutic.  PTA warfarin: 1.25 mg on Mon/Wed/Fri, 2.5 mg all other days  INR today = 1.72, HL = 0.52 CBC stable  Goal of Therapy:  Heparin level 0.3-0.7 units/ml Monitor platelets by anticoagulation protocol: Yes  INR 2-3   Plan:  Warfarin 2.5 mg po x1 Continue heparin at 1100 units / hr Follow up AM labs  Consider stopping Rocephin (8/29>>  )?         Labs:  Recent Labs  02/15/15 0440 02/16/15 0508 02/16/15 1310 02/17/15 0558  HGB 9.2* 9.6*  --  9.3*  HCT 28.1* 29.4*  --  28.5*  PLT 138* 144*  --  153  LABPROT  --  17.8*  --  20.2*  INR  --  1.46  --  1.72*  HEPARINUNFRC 0.27*  --  0.27* 0.52  CREATININE  --  1.08*  --  1.18*  CKTOTAL  --  714*  --   --     Estimated Creatinine Clearance: 23.5 mL/min (by C-G formula based on Cr of 1.18).   Thank you Okey Regal, PharmD [phone]  02/17/2015 2:21 PM

## 2015-02-17 NOTE — Progress Notes (Signed)
   Subjective:  Patient reports pain as mild to moderate.  Denies N/V/CP/SOB.  Objective:   VITALS:   Filed Vitals:   02/16/15 1700 02/16/15 1943 02/16/15 2252 02/17/15 0421  BP: 90/36 110/36 117/35 110/40  Pulse: 60 67 62 68  Temp: 98.5 F (36.9 C)  98.4 F (36.9 C) 99 F (37.2 C)  TempSrc:   Oral Oral  Resp:   17 16  Height:      Weight:      SpO2:   98% 99%    ABD soft Sensation intact distally Intact pulses distally Dorsiflexion/Plantar flexion intact Incision: dressing C/D/I Compartment soft   Lab Results  Component Value Date   WBC 11.2* 02/17/2015   HGB 9.3* 02/17/2015   HCT 28.5* 02/17/2015   MCV 90.2 02/17/2015   PLT 153 02/17/2015   BMET    Component Value Date/Time   NA 134* 02/17/2015 0558   K 4.1 02/17/2015 0558   CL 106 02/17/2015 0558   CO2 21* 02/17/2015 0558   GLUCOSE 139* 02/17/2015 0558   BUN 26* 02/17/2015 0558   CREATININE 1.18* 02/17/2015 0558   CALCIUM 7.7* 02/17/2015 0558   GFRNONAA 39* 02/17/2015 0558   GFRAA 45* 02/17/2015 0558    INR 1.72  Assessment/Plan: 2 Days Post-Op   Principal Problem:   Closed left hip fracture Active Problems:   Acute kidney injury   Hypotension   Anticoagulated on Coumadin   Rhabdomyolysis   Blood glucose elevated   Protein calorie malnutrition   Femur fracture   Intertrochanteric fracture of left femur   WBAT LLE with walker PT/OT UTI per primary team DVT ppx: coumadin, d/c parin gtt when INR >= 2.0 PO pain control Dispo: D/C planning   Sharee Sturdy, [name] 02/17/2015, 10:43 AM   Samson Frederic, MD Cell [phone]

## 2015-02-18 DIAGNOSIS — L8990 Pressure ulcer of unspecified site, unspecified stage: Secondary | ICD-10-CM | POA: Insufficient documentation

## 2015-02-18 LAB — BASIC METABOLIC PANEL
Anion gap: 6 (ref 5–15)
BUN: 26 mg/dL — AB (ref 6–20)
CO2: 21 mmol/L — ABNORMAL LOW (ref 22–32)
CREATININE: 1.07 mg/dL — AB (ref 0.44–1.00)
Calcium: 7.9 mg/dL — ABNORMAL LOW (ref 8.9–10.3)
Chloride: 108 mmol/L (ref 101–111)
GFR, EST AFRICAN AMERICAN: 50 mL/min — AB (ref >60)
GFR, EST NON AFRICAN AMERICAN: 43 mL/min — AB (ref >60)
Glucose, Bld: 115 mg/dL — ABNORMAL HIGH (ref 65–99)
Potassium: 4.5 mmol/L (ref 3.5–5.1)
SODIUM: 135 mmol/L (ref 135–145)

## 2015-02-18 LAB — CBC
HCT: 26.3 % — ABNORMAL LOW (ref 36.0–46.0)
Hemoglobin: 8.5 g/dL — ABNORMAL LOW (ref 12.0–15.0)
MCH: 29 pg (ref 26.0–34.0)
MCHC: 32.3 g/dL (ref 30.0–36.0)
MCV: 89.8 fL (ref 78.0–100.0)
PLATELETS: 187 10*3/uL (ref 150–400)
RBC: 2.93 MIL/uL — AB (ref 3.87–5.11)
RDW: 15.6 % — ABNORMAL HIGH (ref 11.5–15.5)
WBC: 10.4 10*3/uL (ref 4.0–10.5)

## 2015-02-18 LAB — PROTIME-INR
INR: 1.91 — ABNORMAL HIGH (ref 0.00–1.49)
PROTHROMBIN TIME: 21.8 s — AB (ref 11.6–15.2)

## 2015-02-18 LAB — HEPARIN LEVEL (UNFRACTIONATED)
Heparin Unfractionated: 0.23 IU/mL — ABNORMAL LOW (ref 0.30–0.70)
Heparin Unfractionated: 0.1 IU/mL — ABNORMAL LOW (ref 0.30–0.70)

## 2015-02-18 LAB — C DIFFICILE QUICK SCREEN W PCR REFLEX
C Diff antigen: NEGATIVE
C Diff interpretation: NEGATIVE
C Diff toxin: NEGATIVE

## 2015-02-18 MED ORDER — WARFARIN SODIUM 1 MG PO TABS
1.5000 mg | ORAL_TABLET | Freq: Once | ORAL | Status: AC
  Administered 2015-02-18: 1.5 mg via ORAL
  Filled 2015-02-18: qty 1

## 2015-02-18 NOTE — Progress Notes (Signed)
ANTICOAGULATION CONSULT NOTE - Follow-up Consult  Pharmacy Consult for Heparin/Warfarin  Indication: DVT/AFib  Allergies  Allergen Reactions  . Penicillins Hives   Assessment: 81 year old female on PTA warfarin for hx of DVT who presented to the ED after a mechanical fall. POD #2 for closed L hip fracture. Pt received Vitamin K 5 mg x2 doses, it may take longer than normal for her INR to become therapeutic.  PTA warfarin: 1.25 mg on Mon/Wed/Fri, 2.5 mg all other days  INR still slightly subtherapeutic 1.72>>1.91, Hg dropped to 8.5, plt wnl, HL now subtherapeutic (0.23) No bleed/IV line issues per RN  Goal of Therapy:  Heparin level 0.3-0.7 units/ml Monitor platelets by anticoagulation protocol: Yes  INR 2-3   Plan:  Heparin to 1200 units / hr Coumadin 1.5mg  x 1 dose tonight 8h HL Daily HL/CBC/INR Home when INR > 2  Consider stopping Rocephin (8/29>>  )?  Babs Bertin, PharmD Clinical Pharmacist Pager [phone] 02/18/2015 8:32 AM

## 2015-02-18 NOTE — Progress Notes (Signed)
[facility] PROGRESS NOTE  Bridget Pratt WJX:[id number] DOB: 04-25-1920 DOA: 02/12/2015 PCP: Ignatius Specking., MD  Brief narrative: 81 y/o female with HTN, on coumadin for afib and CVA ( had lt MCA stroke in 06),  independent for ADLs and uses walker for ambulation presented to the ED after sustaining a fall at home with left hip fracture. She also had elevated CPK and acute kidney injury.   Assessment/Plan: Closed left hip fracture Secondary to mechanical fall. Pain control with when necessary Vicodin and low-dose morphine. Bowel regimen . Foley placed on admission. Coumadin held for surgery.   - INR reversed with vitamin K for operation to ensue. Currently back on coumadin and awaiting therapeutic INR prior to discharging. 2-D echo with EF of 50-55% and grade 1 diastolic dysfn. No further preoperative cardiac w/up necessary. -  3 days post op.   Acute kidney injury Stable, mild increase in S creatinine  Acute rhabdomyolysis Secondary to fall and being on the floor for several hours. Patient also on statin which has been discontinued.  - Improved with IV fluids.  Fever on 8/28 No fever within the last 24 hours. Admission chest x-ray unremarkable but UA suggests a possible UTI.   - Will continue Rocephin  Anemia Significant drop in H&H since admission. No baseline unknown.  Stool for occult blood pending. Improved with 2 units PRBC on 8/28. Last hb from 04/2014 ( as per PCP office was 13.4) - Patient has no active bleeding reported and on last check improved.  Hx of afib and stroke on coumadin At least since 2006. Spoke with PCP today regarding  drop in H&h. She will need coumadin post op for DVT prophylaxis anyway. He will monitor her h&H as outpt and if persistent drop will weigh on long term use of anticoagulation.  Protein calorie malnutrition Nutrition consult.  DVT prophylaxis: IV heparin. Resume coumadin post op  Diet: NPO for OR  Code Staff: full code Family  Communication: Spoke with son in [place]  (phone [phone]) Disposition Plan: Needs skilled nursing facility postop. Possible discharge on 9/1or 9/2   Consult Orthopedics (Dr Veda Canning)  Procedures:  CT head and cervical spine   antibiotics None  Subjective Pt has no new complaints. No acute issues overnight.   Objective: Filed Vitals:   02/17/15 2235  BP: 113/37  Pulse: 70  Temp: 99.6 F (37.6 C)  Resp: 18    Intake/Output Summary (Last 24 hours) at 02/18/15 1248 Last data filed at 02/18/15 0854  Gross per 24 hour  Intake    453 ml  Output      0 ml  Net    453 ml   Filed Weights   02/12/15 1333  Weight: 56.7 kg (125 lb)    Exam:   Elderly female in no acute distress, hard of hearing  HEENT:  moist oral mucosa, no masses on visual examination.  Chest: Clear to auscultation bilaterally, no wheezes  CVS: Normal S1 and S2, no murmurs rub or gallop  GI: Soft, nondistended, nontender, bowel sounds present , Foley placed on admission.  musculoskeletal: Warm, no edema   CNS: Alert and oriented  Data Reviewed: Basic Metabolic Panel:  Recent Labs Lab 02/13/15 0539 02/14/15 0341 02/16/15 0508 02/17/15 0558 02/18/15 0344  NA 138 138 137 134* 135  K 3.9 3.9 4.4 4.1 4.5  CL 109 109 109 106 108  CO2 22 22 20* 21* 21*  GLUCOSE 132* 112* 131* 139* 115*  BUN 33* 23* 24* 26* 26*  CREATININE 1.28* 1.18* 1.08* 1.18* 1.07*  CALCIUM 7.7* 7.7* 7.6* 7.7* 7.9*   Liver Function Tests:  Recent Labs Lab 02/12/15 1357  AST 51*  ALT 19  ALKPHOS 50  BILITOT 1.0  PROT 6.5  ALBUMIN 3.3*   No results for input(s): LIPASE, AMYLASE in the last 168 hours. No results for input(s): AMMONIA in the last 168 hours. CBC:  Recent Labs Lab 02/12/15 1357  02/14/15 0341 02/15/15 0440 02/16/15 0508 02/17/15 0558 02/18/15 0344  WBC 12.9*  < > 9.6 9.3 9.4 11.2* 10.4  NEUTROABS 9.7*  --   --   --   --   --   --   HGB 11.6*  < > 9.7* 9.2* 9.6* 9.3* 8.5*  HCT 34.6*   < > 29.4* 28.1* 29.4* 28.5* 26.3*  MCV 89.4  < > 87.5 88.9 88.8 90.2 89.8  PLT 221  < > 133* 138* 144* 153 187  < > = values in this interval not displayed. Cardiac Enzymes:  Recent Labs Lab 02/12/15 1357 02/13/15 0539 02/14/15 0341 02/16/15 0508  CKTOTAL 1200* 891* 573* 714*   BNP (last 3 results) No results for input(s): BNP in the last 8760 hours.  ProBNP (last 3 results) No results for input(s): PROBNP in the last 8760 hours.  CBG: No results for input(s): GLUCAP in the last 168 hours.  Recent Results (from the past 240 hour(s))  Surgical pcr screen     Status: None   Collection Time: 02/14/15  8:50 PM  Result Value Ref Range Status   MRSA, PCR NEGATIVE NEGATIVE Final   Staphylococcus aureus NEGATIVE NEGATIVE Final    Comment:        The Xpert SA Assay (FDA approved for NASAL specimens in patients over 80 years of age), is one component of a comprehensive surveillance program.  Test performance has been validated by [facility] for patients greater than or equal to 36 year old. It is not intended to diagnose infection nor to guide or monitor treatment.   C difficile quick scan w PCR reflex     Status: None   Collection Time: 02/18/15  5:39 AM  Result Value Ref Range Status   C Diff antigen NEGATIVE NEGATIVE Final   C Diff toxin NEGATIVE NEGATIVE Final   C Diff interpretation Negative for toxigenic C. difficile  Final     Studies: No results found.  Scheduled Meds: . cefTRIAXone (ROCEPHIN)  IV  1 g Intravenous Q24H  . feeding supplement (ENSURE ENLIVE)  237 mL Oral BID BM  . simvastatin  20 mg Oral q1800  . warfarin  1.5 mg Oral ONCE-1800  . Warfarin - Pharmacist Dosing Inpatient   Does not apply q1800   Continuous Infusions: . sodium chloride 10 mL/hr at 02/17/15 2045  . heparin 1,200 Units/hr (02/18/15 0912)    Time spent: 25 MINUTES   Taran Haynesworth, [facility]  [facility] Pager  [phone] If 7PM-7AM, please contact night-coverage at  [email or web address], password [email or web address] 02/18/2015, 12:48 PM  LOS: 6 days

## 2015-02-18 NOTE — Progress Notes (Signed)
Pt with 3 loose stools this shift. NP on call notified. Orders received for pt to be placed on Enteric Precautions and stool sample to be sent for C. Diff PCR.

## 2015-02-18 NOTE — Clinical Social Work Note (Signed)
Patient not medically stable for discharge today, however should be ready for discharge to [facility] on Saturday, 9/3.  Genelle Bal, MSW, LCSW Licensed Clinical Social Worker Clinical Social Work Department [employer] [phone]

## 2015-02-18 NOTE — Progress Notes (Signed)
   Subjective:  Patient reports pain as mild to moderate.  Denies N/V/CP/SOB.  Objective:   VITALS:   Filed Vitals:   02/16/15 2252 02/17/15 0421 02/17/15 1300 02/17/15 2235  BP: 117/35 110/40 116/39 113/37  Pulse: 62 68 77 70  Temp: 98.4 F (36.9 C) 99 F (37.2 C) 100.2 F (37.9 C) 99.6 F (37.6 C)  TempSrc: Oral Oral  Oral  Resp: Height:      Weight:      SpO2: 98% 99% 98% 100%    ABD soft Sensation intact distally Intact pulses distally Dorsiflexion/Plantar flexion intact Incision: dressing C/D/I Compartment soft   Lab Results  Component Value Date   WBC 10.4 02/18/2015   HGB 8.5* 02/18/2015   HCT 26.3* 02/18/2015   MCV 89.8 02/18/2015   PLT 187 02/18/2015   BMET    Component Value Date/Time   NA 135 02/18/2015 0344   K 4.5 02/18/2015 0344   CL 108 02/18/2015 0344   CO2 21* 02/18/2015 0344   GLUCOSE 115* 02/18/2015 0344   BUN 26* 02/18/2015 0344   CREATININE 1.07* 02/18/2015 0344   CALCIUM 7.9* 02/18/2015 0344   GFRNONAA 43* 02/18/2015 0344   GFRAA 50* 02/18/2015 0344    INR 1.91  Assessment/Plan: 3 Days Post-Op   Principal Problem:   Closed left hip fracture Active Problems:   Acute kidney injury   Hypotension   Anticoagulated on Coumadin   Rhabdomyolysis   Blood glucose elevated   Protein calorie malnutrition   Femur fracture   Intertrochanteric fracture of left femur   Pressure ulcer   WBAT LLE with walker PT/OT DVT ppx: coumadin, SCDs, TEDs PO pain control Dispo: D/C planning, SNF   Kerina Simoneau, [name] 02/18/2015, 12:59 PM   Samson Frederic, MD Cell [phone]

## 2015-02-18 NOTE — Progress Notes (Signed)
Physical Therapy Treatment Patient Details Name: Bridget Pratt MRN: [id number] DOB: Oct 18, 1919 Today's Date: 02/18/2015    History of Present Illness Pt s/p L hip fx with IM nail repair    PT Comments    Pt was seen for evaluation of her tolerance for sitting and to attempt standing, but she is not able to control her sitting balance well enough yet.  Has edema and discomfort on her L hip and mainly worked to get her to assist transition to sit, sit control and her return to bed on bedpan (at her request).  Will continue on to work toward standing and getting OOB to chair.  Follow Up Recommendations  SNF     Equipment Recommendations  None recommended by PT    Recommendations for Other Services OT consult     Precautions / Restrictions Precautions Precautions: Fall Restrictions Weight Bearing Restrictions: Yes LLE Weight Bearing: Weight bearing as tolerated    Mobility  Bed Mobility Overal bed mobility: Needs Assistance Bed Mobility: Supine to Sit;Sit to Supine     Supine to sit: Max assist Sit to supine: Max assist   General bed mobility comments: Assisted her to initiate LLE movement and to help control initial sitting.  Transfers                 General transfer comment: Attempted but had to focus on sitting control as she is not allowing PT to help her WB on L hip in sitting.  Her tolerance for mobility is low, and will need to focus on sit control first.  Ambulation/Gait             General Gait Details: unable to stand   Stairs            Wheelchair Mobility    Modified Rankin (Stroke Patients Only)       Balance Overall balance assessment: Needs assistance Sitting-balance support: Bilateral upper extremity supported Sitting balance-Leahy Scale: Poor Sitting balance - Comments:  (resists even distribution of wbing on L hip) Postural control: Right lateral lean;Posterior lean                          Cognition  Arousal/Alertness: Awake/alert Behavior During Therapy: WFL for tasks assessed/performed Overall Cognitive Status: Within Functional Limits for tasks assessed                      Exercises      General Comments        Pertinent Vitals/Pain Pain Assessment: Faces Faces Pain Scale: Hurts even more Pain Location: L hip Pain Descriptors / Indicators: Operative site guarding;Aching Pain Intervention(s): Limited activity within patient's tolerance;Monitored during session;Repositioned;Premedicated before session    Home Living                      Prior Function            PT Goals (current goals can now be found in the care plan section) Acute Rehab PT Goals Patient Stated Goal: to stand up Progress towards PT goals: Progressing toward goals (sitting is improved from last visit)    Frequency  Min 3X/week    PT Plan Current plan remains appropriate    Co-evaluation             End of Session Equipment Utilized During Treatment: Oxygen Activity Tolerance: Patient limited by pain Patient left: in bed;with call bell/phone within reach  Time: 1610-9604 PT Time Calculation (min) (ACUTE ONLY): 22 min  Charges:  $Therapeutic Activity: 8-22 mins                    G Codes:      Ivar Drape 03/04/2015, 7:52 AM   Samul Dada, PT MS Acute Rehab Dept. Number: [facility] [id number] and [facility] [phone]

## 2015-02-19 LAB — CBC
HCT: 25.5 % — ABNORMAL LOW (ref 36.0–46.0)
HEMOGLOBIN: 8.3 g/dL — AB (ref 12.0–15.0)
MCH: 29.7 pg (ref 26.0–34.0)
MCHC: 32.5 g/dL (ref 30.0–36.0)
MCV: 91.4 fL (ref 78.0–100.0)
Platelets: 208 10*3/uL (ref 150–400)
RBC: 2.79 MIL/uL — AB (ref 3.87–5.11)
RDW: 15.6 % — ABNORMAL HIGH (ref 11.5–15.5)
WBC: 9.3 10*3/uL (ref 4.0–10.5)

## 2015-02-19 LAB — BASIC METABOLIC PANEL
Anion gap: 4 — ABNORMAL LOW (ref 5–15)
BUN: 27 mg/dL — AB (ref 6–20)
CHLORIDE: 105 mmol/L (ref 101–111)
CO2: 28 mmol/L (ref 22–32)
CREATININE: 1.03 mg/dL — AB (ref 0.44–1.00)
Calcium: 8.2 mg/dL — ABNORMAL LOW (ref 8.9–10.3)
GFR calc Af Amer: 53 mL/min — ABNORMAL LOW (ref >60)
GFR calc non Af Amer: 45 mL/min — ABNORMAL LOW (ref >60)
GLUCOSE: 107 mg/dL — AB (ref 65–99)
POTASSIUM: 4.5 mmol/L (ref 3.5–5.1)
Sodium: 137 mmol/L (ref 135–145)

## 2015-02-19 LAB — PROTIME-INR
INR: 1.68 — ABNORMAL HIGH (ref 0.00–1.49)
Prothrombin Time: 19.8 seconds — ABNORMAL HIGH (ref 11.6–15.2)

## 2015-02-19 MED ORDER — CEFDINIR 300 MG PO CAPS: 300.0000 mg | ORAL_CAPSULE | Freq: Two times a day (BID) | ORAL | Status: AC

## 2015-02-19 MED ORDER — ENSURE ENLIVE PO LIQD: 237.0000 mL | Freq: Two times a day (BID) | ORAL | Status: AC

## 2015-02-19 MED ORDER — WARFARIN SODIUM 2.5 MG PO TABS: 1.2500 mg | ORAL_TABLET | Freq: Every day | ORAL | Status: AC

## 2015-02-19 MED ORDER — WARFARIN SODIUM 5 MG PO TABS: 2.5000 mg | ORAL_TABLET | Freq: Once | ORAL | Status: DC

## 2015-02-19 MED ORDER — ENOXAPARIN SODIUM 60 MG/0.6ML ~~LOC~~ SOLN
1.0000 mg/kg | SUBCUTANEOUS | Status: DC
  Administered 2015-02-19: 55 mg via SUBCUTANEOUS
  Filled 2015-02-19: qty 0.6

## 2015-02-19 MED ORDER — ENOXAPARIN SODIUM 150 MG/ML ~~LOC~~ SOLN: 1.0000 mg/kg | SUBCUTANEOUS | Status: AC

## 2015-02-19 NOTE — Discharge Summary (Addendum)
Physician Discharge Summary  Bridget Pratt [facility]:[id number] DOB: [birth year] DOA: 02/12/2015  PCP: Glenda Chroman., MD  Admit date: 02/12/2015 Discharge date: 02/19/2015  Time spent: 35 minutes  Recommendations for Outpatient Follow-up:  1. Please refer to d/c instructions below 2. Pharmacist or pcp at SNF to manage coumadin dosing. Lovenox until INR therapeutic for 24 hours. 3. She will need PT while at facility  Discharge Diagnoses:  Principal Problem:   Closed left hip fracture Active Problems:   Acute kidney injury   Hypotension   Anticoagulated on Coumadin   Rhabdomyolysis   Blood glucose elevated   Protein calorie malnutrition   Femur fracture   Intertrochanteric fracture of left femur   Pressure ulcer   Discharge Condition: stable  Diet recommendation: regular diet  Filed Weights   02/12/15 1333  Weight: 56.7 kg (125 lb)    History of present illness:  81 y/o with history of blood clots on warfarin  that presented to the hospital after a fall and found to have a hip fracture  Hospital Course:  Hip fracture - s/p 4 days procedure listed below - coumadin for dvt prophylaxis. Bridging with lovenox. Please see d/c instructions. Pharmacist to continue monitoring INR levels and adjusting coumadin dose - Pt to continue physical therapy while at facility - D/C to SNF  Addendum: Protein calorie malnutrition - Please refer to dietitian's notes agree with findings - d/c with nutritional supplementation.  UTI - Patient with presumed uti and administered rocephin for 5 days. Will continue 3rd generation cephalosporin for 2 more days after d/c to complete a total antibiotic course of 7 days. For presumed uti  Procedures: 4 Days Post-Op Procedure(s) (LRB): INTRAMEDULLARY (IM) NAIL INTERTROCHANTRIC (Left)  Consultations:  Orthopaedic surgeon  Discharge Exam: Filed Vitals:   02/19/15 0600  BP: 113/34  Pulse: 67  Temp: 99.5 F (37.5 C)  Resp: 18    General: Pt  in nad, alert and awake Cardiovascular: rrr, no mrg Respiratory: cta bl, no wheezes  Discharge Instructions   Discharge Instructions    Call MD for:  redness, tenderness, or signs of infection (pain, swelling, redness, odor or green/yellow discharge around incision site)    Complete by:  As directed      Call MD for:  severe uncontrolled pain    Complete by:  As directed      Call MD for:  temperature >100.4    Complete by:  As directed      Diet - low sodium heart healthy    Complete by:  As directed      Discharge instructions    Complete by:  As directed   Please be sure to follow up with your primary care physician at the SNF. You will need to have your INR levels monitored daily and your coumadin adjusted by your pharmacist daily. Continue lovenox until your INR is 2-3 for a 24 hour period. When this criteria is met you may discontinue lovenox. If any questions arise please discuss with pharmacist at the facility     Increase activity slowly    Complete by:  As directed           Current Discharge Medication List    START taking these medications   Details  cefdinir (OMNICEF) 300 MG capsule Take 1 capsule (300 mg total) by mouth 2 (two) times daily. Qty: 4 capsule, Refills: 0    enoxaparin (LOVENOX) 150 MG/ML injection Inject 0.38 mLs (55 mg total) into the skin daily. Qty: 3  Syringe, Refills: 0    feeding supplement, ENSURE ENLIVE, (ENSURE ENLIVE) LIQD Take 237 mLs by mouth 2 (two) times daily between meals. Qty: 237 mL, Refills: 12    HYDROcodone-acetaminophen (NORCO/VICODIN) 5-325 MG per tablet Take 1-2 tablets by mouth every 6 (six) hours as needed for moderate pain. Qty: 80 tablet, Refills: 0      CONTINUE these medications which have CHANGED   Details  warfarin (COUMADIN) 2.5 MG tablet Take 0.5-1 tablets (1.25-2.5 mg total) by mouth daily at 6 PM. 1.25 mg on Mon, Wed, and Fri.  2.5 mg all other days.      CONTINUE these medications which have NOT CHANGED    Details  simvastatin (ZOCOR) 20 MG tablet Take 20 mg by mouth daily at 6 PM.       STOP taking these medications     aspirin EC 81 MG tablet      valsartan-hydrochlorothiazide (DIOVAN-HCT) 160-12.5 MG per tablet        Allergies  Allergen Reactions  . Penicillins Hives   Follow-up Information    Follow up with Swinteck, Horald Pollen, MD. Schedule an appointment as soon as possible for a visit in 2 weeks.   Specialty:  Orthopedic Surgery   Why:  For wound re-check   Contact information:   [address] [address] [phone]        The results of significant diagnostics from this hospitalization (including imaging, microbiology, ancillary and laboratory) are listed below for reference.    Significant Diagnostic Studies: Ct Head Wo Contrast  02/12/2015   CLINICAL DATA:  Found on floor this morning. Fell last night. Left hip and neck pain. Patient on Coumadin. Hypertension.  EXAM: CT HEAD WITHOUT CONTRAST  CT CERVICAL SPINE WITHOUT CONTRAST  TECHNIQUE: Multidetector CT imaging of the head and cervical spine was performed following the standard protocol without intravenous contrast. Multiplanar CT image reconstructions of the cervical spine were also generated.  COMPARISON:  None.  FINDINGS: CT HEAD FINDINGS  There is atrophy and chronic small vessel disease changes. No acute intracranial abnormality. Specifically, no hemorrhage, hydrocephalus, mass lesion, acute infarction, or significant intracranial injury. No acute calvarial abnormality. Visualized paranasal sinuses and mastoids clear. Orbital soft tissues unremarkable.  CT CERVICAL SPINE FINDINGS  Normal alignment. Diffuse degenerative disc and facet disease throughout the cervical spine. Prevertebral soft tissues are normal. No fracture. No epidural or paraspinal hematoma.  IMPRESSION: No acute intracranial abnormality.  Atrophy, chronic microvascular disease.  Cervical spondylosis.  No acute bony  abnormality.   Electronically Signed   By: Rolm Baptise M.D.   On: 02/12/2015 14:56   Ct Cervical Spine Wo Contrast  02/12/2015   CLINICAL DATA:  Found on floor this morning. Fell last night. Left hip and neck pain. Patient on Coumadin. Hypertension.  EXAM: CT HEAD WITHOUT CONTRAST  CT CERVICAL SPINE WITHOUT CONTRAST  TECHNIQUE: Multidetector CT imaging of the head and cervical spine was performed following the standard protocol without intravenous contrast. Multiplanar CT image reconstructions of the cervical spine were also generated.  COMPARISON:  None.  FINDINGS: CT HEAD FINDINGS  There is atrophy and chronic small vessel disease changes. No acute intracranial abnormality. Specifically, no hemorrhage, hydrocephalus, mass lesion, acute infarction, or significant intracranial injury. No acute calvarial abnormality. Visualized paranasal sinuses and mastoids clear. Orbital soft tissues unremarkable.  CT CERVICAL SPINE FINDINGS  Normal alignment. Diffuse degenerative disc and facet disease throughout the cervical spine. Prevertebral soft tissues are normal. No fracture. No  epidural or paraspinal hematoma.  IMPRESSION: No acute intracranial abnormality.  Atrophy, chronic microvascular disease.  Cervical spondylosis.  No acute bony abnormality.   Electronically Signed   By: Rolm Baptise M.D.   On: 02/12/2015 14:56   Pelvis Portable  02/15/2015   CLINICAL DATA:  Evaluate internal fixation of left femoral fracture. Initial encounter.  EXAM: PORTABLE PELVIS 1-2 VIEWS  COMPARISON:  Intraoperative images performed earlier today at 6:42 p.m.  FINDINGS: The patient's intramedullary rod and screw at the left femur transfix the intertrochanteric fracture in grossly anatomic alignment. No new fractures are seen. A minimally displaced lesser trochanteric fragment is seen. Right femoral hardware appears grossly intact, without evidence of loosening.  Scattered vascular calcifications are seen.  IMPRESSION: Status post  internal fixation of the patient's left femoral intertrochanteric fracture in grossly anatomic alignment. Minimally displaced lesser trochanteric fragment seen.   Electronically Signed   By: Garald Balding M.D.   On: 02/15/2015 20:12   Dg Chest Port 1 View  02/12/2015   CLINICAL DATA:  81 year old female with history of fall yesterday evening found down on the floor this morning.  EXAM: PORTABLE CHEST - 1 VIEW  COMPARISON:  No priors.  FINDINGS: Lung volumes are normal. No consolidative airspace disease. No pleural effusions. Diffuse peribronchial cuffing. Mild cephalization of the pulmonary vasculature. Mild indistinctness of interstitial markings. Mild cardiomegaly. Slight prominence of upper mediastinal contours may be projectional. Atherosclerosis in the thoracic aorta.  IMPRESSION: 1. The appearance the chest suggests mild congestive heart failure. 2. Atherosclerosis.   Electronically Signed   By: Vinnie Langton M.D.   On: 02/12/2015 14:23   Dg C-arm 1-60 Min  02/15/2015   CLINICAL DATA:  Left intertrochanteric hip fracture fixation. Initial encounter.  EXAM: LEFT FEMUR 2 VIEWS; DG C-ARM 61-120 MIN  COMPARISON:  Radiographs 02/12/2015.  FLUOROSCOPY TIME:  C-arm fluoroscopic images were obtained intraoperatively and submitted for post operative interpretation. Please see the performing provider's procedural report for the fluoroscopy time utilized.  FINDINGS: Four spot fluoroscopic images demonstrate fixation of the intertrochanteric left femur fracture with an intramedullary nail and interlocking distal screw. The main fracture fragments demonstrate improved alignment. No complications identified.  IMPRESSION: Intraoperative views during left femur ORIF. No demonstrated complication.   Electronically Signed   By: Richardean Sale M.D.   On: 02/15/2015 19:14   Dg Hip Unilat With Pelvis 2-3 Views Left  02/12/2015   CLINICAL DATA:  Fall last night with left hip pain and left leg numbness.  EXAM: DG HIP  (WITH OR WITHOUT PELVIS) 2-3V LEFT  COMPARISON:  None.  FINDINGS: Exam demonstrates moderate diffuse decreased bone mineralization. There are mild symmetric degenerative changes of the hips. Compression screw and lateral fixation plate is present over the right femur intact. There is a displaced intertrochanteric fracture of the left proximal femur with mild exaggerated coxa vara deformity. Moderate posterior displacement of the distal fragment.  There are degenerative changes of the spine and sacroiliac joints. Atherosclerotic plaque is present over the femoral arteries.  IMPRESSION: Displaced intertrochanteric fracture of the left femur.   Electronically Signed   By: Marin Olp M.D.   On: 02/12/2015 14:43   Dg Femur 1v Left  02/12/2015   CLINICAL DATA:  Pt. Fell last night - found by postman this AM. Pain left hip and numbness left leg.  EXAM: LEFT FEMUR 1 VIEW  COMPARISON:  None.  FINDINGS: Study includes the proximal femoral shaft through the knee. The hip is not visualized on  this exam.  No fracture or bone lesion on the included field of view. Knee joint is normally aligned with medial joint space compartment narrowing.  Bones are demineralized.  There are vascular calcifications medially.  IMPRESSION: No fracture or acute finding on the limited imaging acquired.   Electronically Signed   By: Lajean Manes M.D.   On: 02/12/2015 14:39   Dg Femur Min 2 Views Left  02/15/2015   CLINICAL DATA:  Postoperative exam after left intertrochanteric femoral fracture fixation  EXAM: LEFT FEMUR 2 VIEWS  COMPARISON:  Intraoperative imaging same date  FINDINGS: Fracture fragments are in near anatomic alignment after dynamic left femoral intra medullary nail fixation of the previously seen intertrochanteric fracture. No evidence for hardware failure. No new fracture line. Degenerative change noted at the knee.  IMPRESSION: Expected postoperative appearance after left dynamic nail femoral fixation.   Electronically  Signed   By: Conchita Paris M.D.   On: 02/15/2015 21:22   Dg Femur Min 2 Views Left  02/15/2015   CLINICAL DATA:  Left intertrochanteric hip fracture fixation. Initial encounter.  EXAM: LEFT FEMUR 2 VIEWS; DG C-ARM 61-120 MIN  COMPARISON:  Radiographs 02/12/2015.  FLUOROSCOPY TIME:  C-arm fluoroscopic images were obtained intraoperatively and submitted for post operative interpretation. Please see the performing provider's procedural report for the fluoroscopy time utilized.  FINDINGS: Four spot fluoroscopic images demonstrate fixation of the intertrochanteric left femur fracture with an intramedullary nail and interlocking distal screw. The main fracture fragments demonstrate improved alignment. No complications identified.  IMPRESSION: Intraoperative views during left femur ORIF. No demonstrated complication.   Electronically Signed   By: Richardean Sale M.D.   On: 02/15/2015 19:14    Microbiology: Recent Results (from the past 240 hour(s))  Surgical pcr screen     Status: None   Collection Time: 02/14/15  8:50 PM  Result Value Ref Range Status   MRSA, PCR NEGATIVE NEGATIVE Final   Staphylococcus aureus NEGATIVE NEGATIVE Final    Comment:        The Xpert SA Assay (FDA approved for NASAL specimens in patients over 78 years of age), is one component of a comprehensive surveillance program.  Test performance has been validated by [facility] for patients greater than or equal to 61 year old. It is not intended to diagnose infection nor to guide or monitor treatment.   C difficile quick scan w PCR reflex     Status: None   Collection Time: 02/18/15  5:39 AM  Result Value Ref Range Status   C Diff antigen NEGATIVE NEGATIVE Final   C Diff toxin NEGATIVE NEGATIVE Final   C Diff interpretation Negative for toxigenic C. difficile  Final     Labs: Basic Metabolic Panel:  Recent Labs Lab 02/14/15 0341 02/16/15 0508 02/17/15 0558 02/18/15 0344 02/19/15 0338  NA 138 137 134* 135  137  K 3.9 4.4 4.1 4.5 4.5  CL 109 109 106 108 105  CO2 22 20* 21* 21* 28  GLUCOSE 112* 131* 139* 115* 107*  BUN 23* 24* 26* 26* 27*  CREATININE 1.18* 1.08* 1.18* 1.07* 1.03*  CALCIUM 7.7* 7.6* 7.7* 7.9* 8.2*   Liver Function Tests:  Recent Labs Lab 02/12/15 1357  AST 51*  ALT 19  ALKPHOS 50  BILITOT 1.0  PROT 6.5  ALBUMIN 3.3*   No results for input(s): LIPASE, AMYLASE in the last 168 hours. No results for input(s): AMMONIA in the last 168 hours. CBC:  Recent Labs Lab 02/12/15 1357  02/15/15 0440 02/16/15 0508 02/17/15 0558 02/18/15 0344 02/19/15 0338  WBC 12.9*  < > 9.3 9.4 11.2* 10.4 9.3  NEUTROABS 9.7*  --   --   --   --   --   --   HGB 11.6*  < > 9.2* 9.6* 9.3* 8.5* 8.3*  HCT 34.6*  < > 28.1* 29.4* 28.5* 26.3* 25.5*  MCV 89.4  < > 88.9 88.8 90.2 89.8 91.4  PLT 221  < > 138* 144* 153 187 208  < > = values in this interval not displayed. Cardiac Enzymes:  Recent Labs Lab 02/12/15 1357 02/13/15 0539 02/14/15 0341 02/16/15 0508  CKTOTAL 1200* 891* 573* 714*   BNP: BNP (last 3 results) No results for input(s): BNP in the last 8760 hours.  ProBNP (last 3 results) No results for input(s): PROBNP in the last 8760 hours.  CBG: No results for input(s): GLUCAP in the last 168 hours.     Signed:  Velvet Bathe  [facility] 02/19/2015, 11:35 AM

## 2015-02-19 NOTE — Progress Notes (Signed)
ANTICOAGULATION CONSULT NOTE - Follow-up Consult  Pharmacy Consult for Warfarin  Indication: DVT/AFib  Allergies  Allergen Reactions  . Penicillins Hives   Assessment: 81 year old female on PTA warfarin for hx of DVT who presented to the ED after a mechanical fall. POD #2 for closed L hip fracture. Pt received Vitamin K 5 mg x2 doses, it may take longer than normal for her INR to become therapeutic.  PTA warfarin: 1.25 mg on Mon/Wed/Fri, 2.5 mg all other days  Heparin D/C continued yesterday per Dr. Cena Benton. Now on lovenox  q24h. INR today 1.68 down from 1.91 after 1.5mg  warfarin. H/H stable. No bleed/IV line issues per RN. Patient possibly being discharged today.  Goal of Therapy:  INR 2-3   Plan:  Coumadin 2.5mg  x 1 dose tonight Daily INR  Monitor s/sx bleeding  Sherle Poe, PharmD PGY1 Resident Pager [phone]  02/19/2015 11:03 AM

## 2015-02-19 NOTE — Progress Notes (Signed)
Subjective: 4 Days Post-Op Procedure(s) (LRB): INTRAMEDULLARY (IM) NAIL INTERTROCHANTRIC (Left) Patient reports pain as mild to left hip.  Tolerating Po's. Up with PT. Denies Sob,cp, or calf pain.  Objective: Vital signs in last 24 hours: Temp:  [98.7 F (37.1 C)-99.5 F (37.5 C)] 99.5 F (37.5 C) (09/03 0600) Pulse Rate:  [66-73] 67 (09/03 0600) Resp:  [17-18] 18 (09/03 0600) BP: (106-113)/(34-45) 113/34 mmHg (09/03 0600) SpO2:  [100 %] 100 % (09/03 0600)  Intake/Output from previous day: 09/02 0701 - 09/03 0700 In: 593.3 [P.O.:360; I.V.:233.3] Out: -  Intake/Output this shift:     Recent Labs  02/17/15 0558 02/18/15 0344 02/19/15 0338  HGB 9.3* 8.5* 8.3*    Recent Labs  02/18/15 0344 02/19/15 0338  WBC 10.4 9.3  RBC 2.93* 2.79*  HCT 26.3* 25.5*  PLT 187 208    Recent Labs  02/18/15 0344 02/19/15 0338  NA 135 137  K 4.5 4.5  CL 108 105  CO2 21* 28  BUN 26* 27*  CREATININE 1.07* 1.03*  GLUCOSE 115* 107*  CALCIUM 7.9* 8.2*    Recent Labs  02/18/15 0344 02/19/15 0338  INR 1.91* 1.68*    Alert and oriented x3. RRR, Lungs clear, BS x4. Left Calf soft and non tender. L hip dressing C/D/I. No DVT signs. No signs of infection or compartment syndrome. LLE grossly neurovascularly intact.   Assessment/Plan: 4 Days Post-Op Procedure(s) (LRB): INTRAMEDULLARY (IM) NAIL INTERTROCHANTRIC (Left) Up with PT WBAT with walker Plan to D/c to SNF when ready, per medicine On Coumadin Continue other care  Laurie Lovejoy L 02/19/2015, 9:42 AM

## 2015-02-19 NOTE — Progress Notes (Signed)
CSW (Clinical Child psychotherapist) prepared pt dc packet and placed with shadow chart. CSW arranged non-emergent ambulance transport. Pt son Davon, pt nurse, and facility informed. CSW signing off.  Harless Nakayama Weekend CSW [phone]

## 2015-08-17 DEATH — deceased

## 2017-02-12 IMAGING — RF DG C-ARM 61-120 MIN
1 series · 4 of 4 positions shown · non-contrast
Comparison: Radiographs 02/12/2015.

CLINICAL DATA: Left intertrochanteric hip fracture fixation.
Initial encounter.

EXAM:
LEFT FEMUR 2 VIEWS; DG C-ARM 61-120 MIN

[Series 1: run · 4 of 4 slices shown]
[im 1/4]
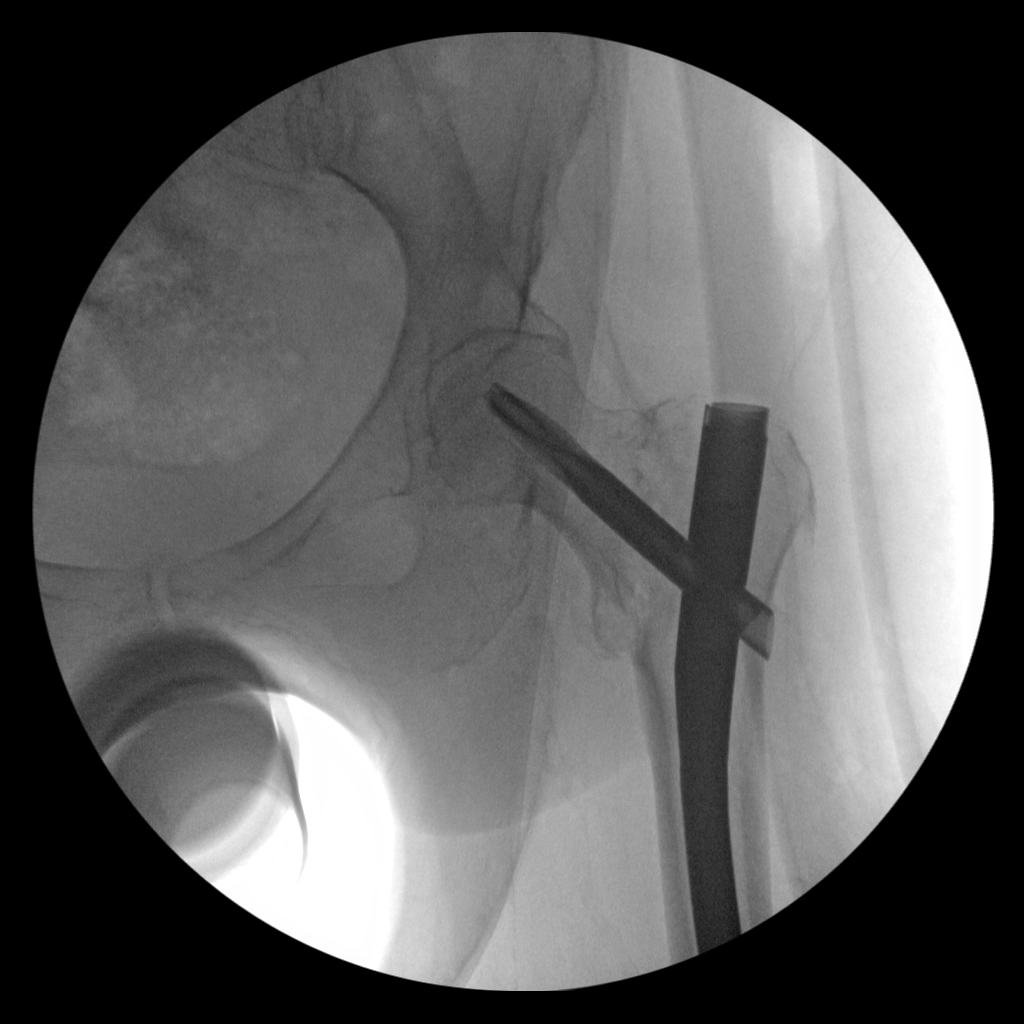
[im 2/4]
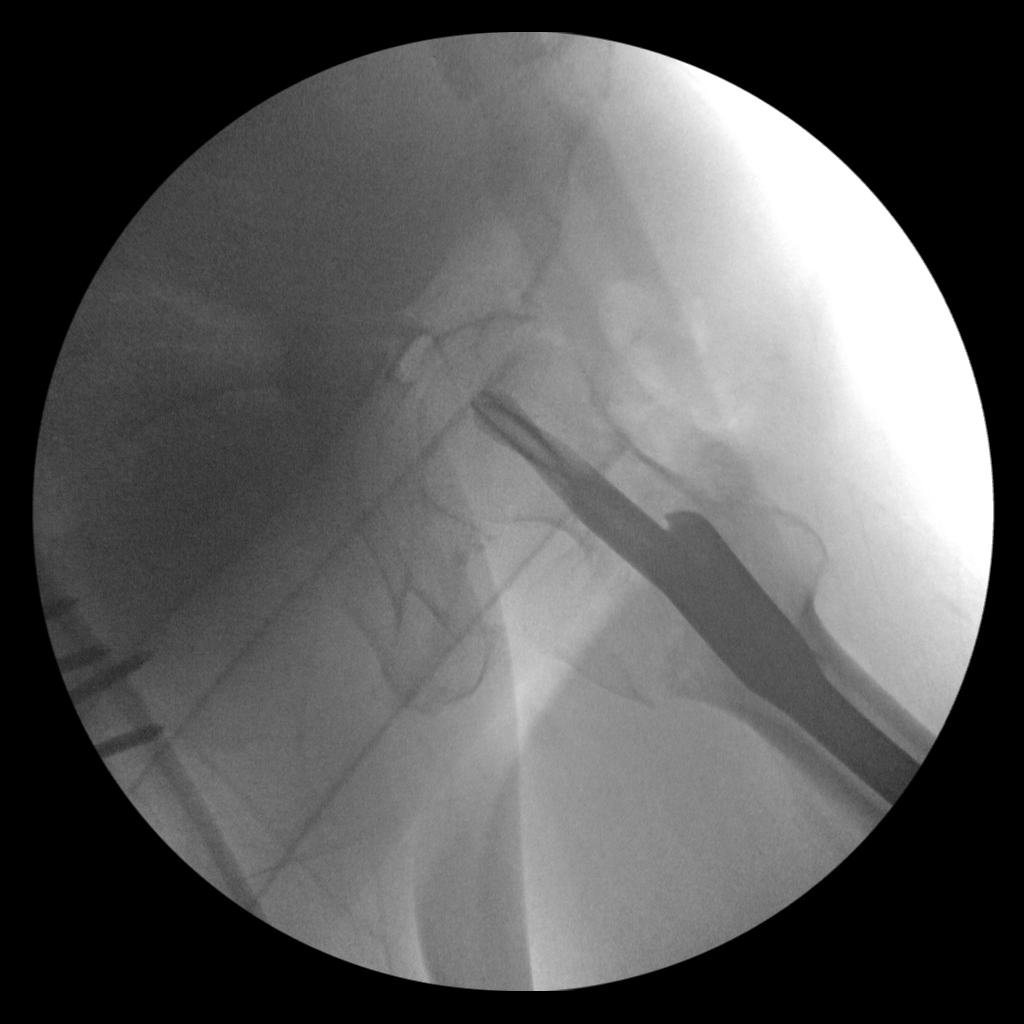
[im 3/4]
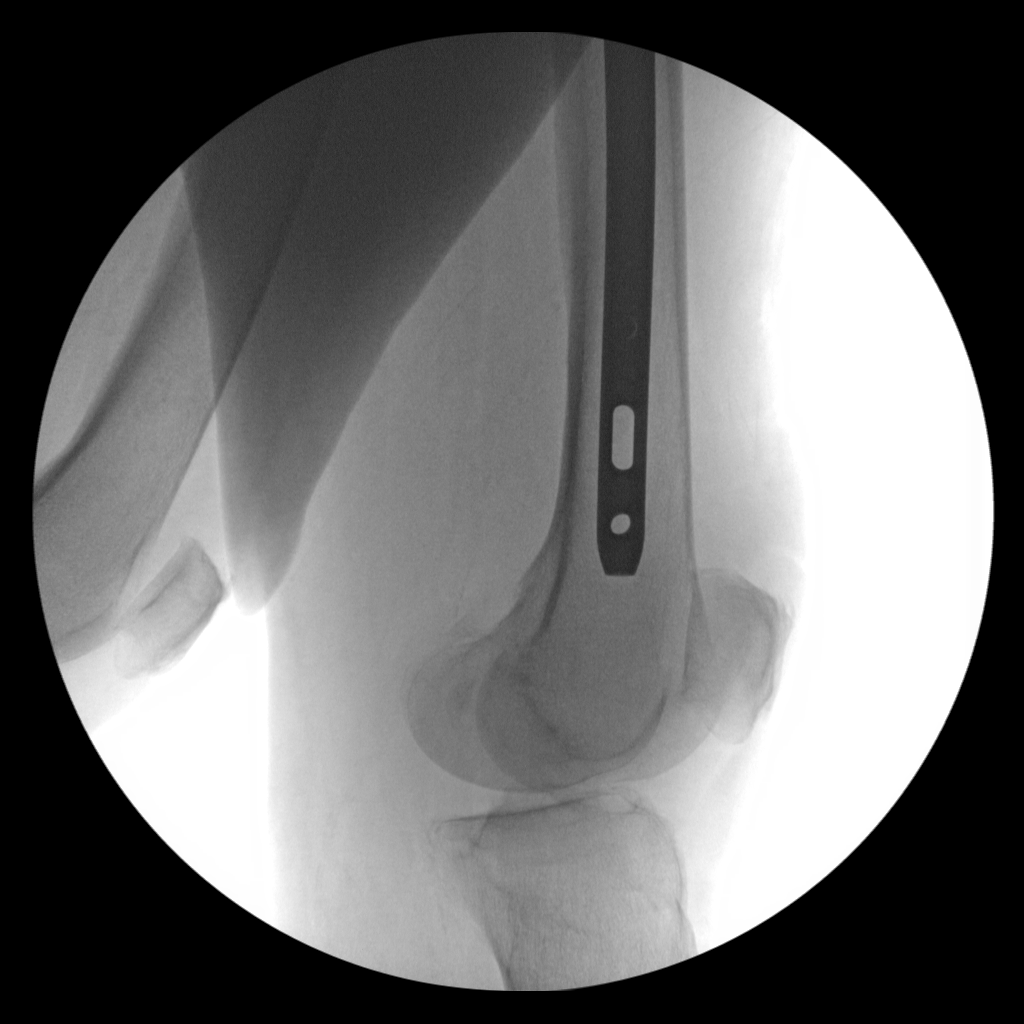
[im 4/4]
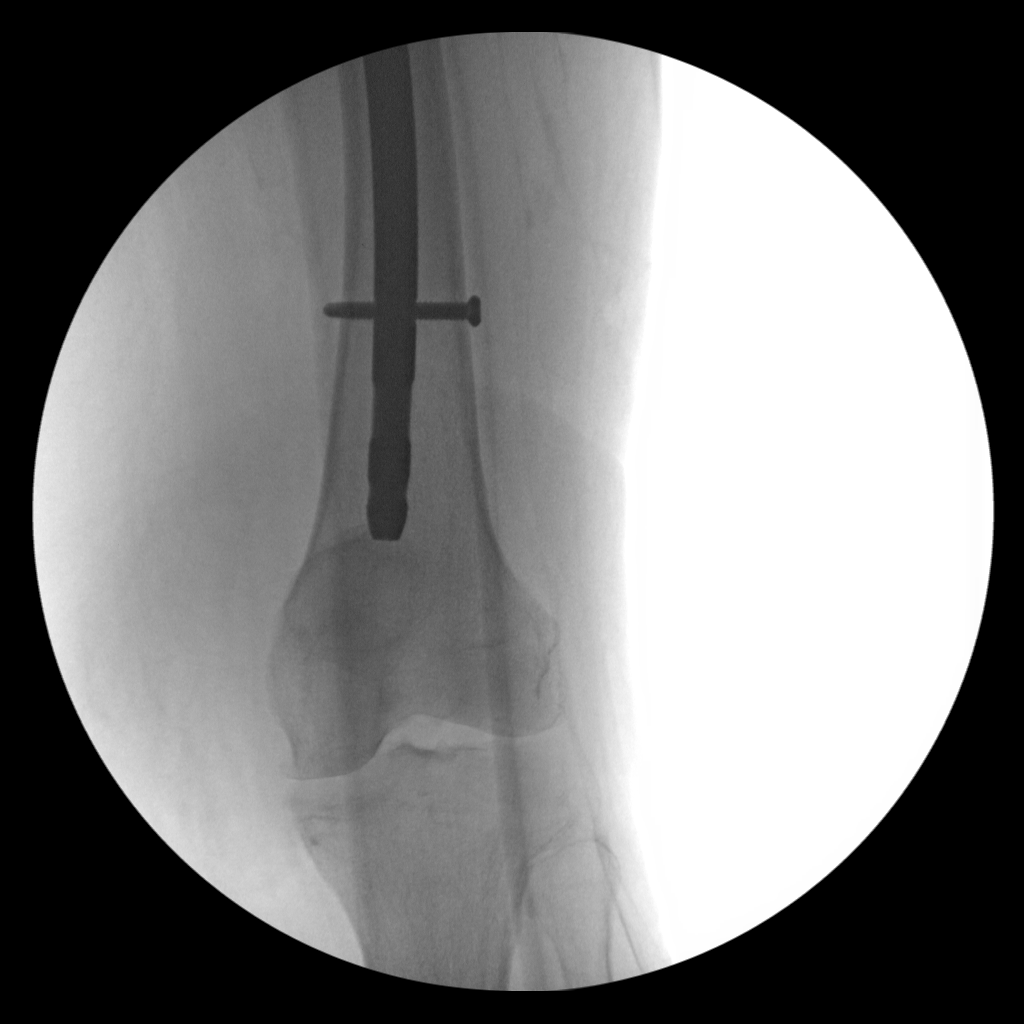

[4 of 4 positions shown; findings below may reference images not displayed]

FLUOROSCOPY TIME:

C-arm fluoroscopic images were obtained intraoperatively and
submitted for post operative interpretation. Please see the
performing provider's procedural report for the fluoroscopy time
utilized.
FINDINGS: Four spot fluoroscopic images demonstrate fixation of the
intertrochanteric left femur fracture with an intramedullary nail
and interlocking distal screw. The main fracture fragments
demonstrate improved alignment. No complications identified.
IMPRESSION: Intraoperative views during left femur ORIF. No demonstrated
complication.

## 2017-02-12 IMAGING — CR DG PORTABLE PELVIS
2 series · 2 of 2 positions shown · non-contrast
Comparison: Intraoperative images performed earlier today at [DATE]
p.m.

CLINICAL DATA: Evaluate internal fixation of left femoral fracture.
Initial encounter.

EXAM:
PORTABLE PELVIS 1-2 VIEWS

[AP (1 of 2)]
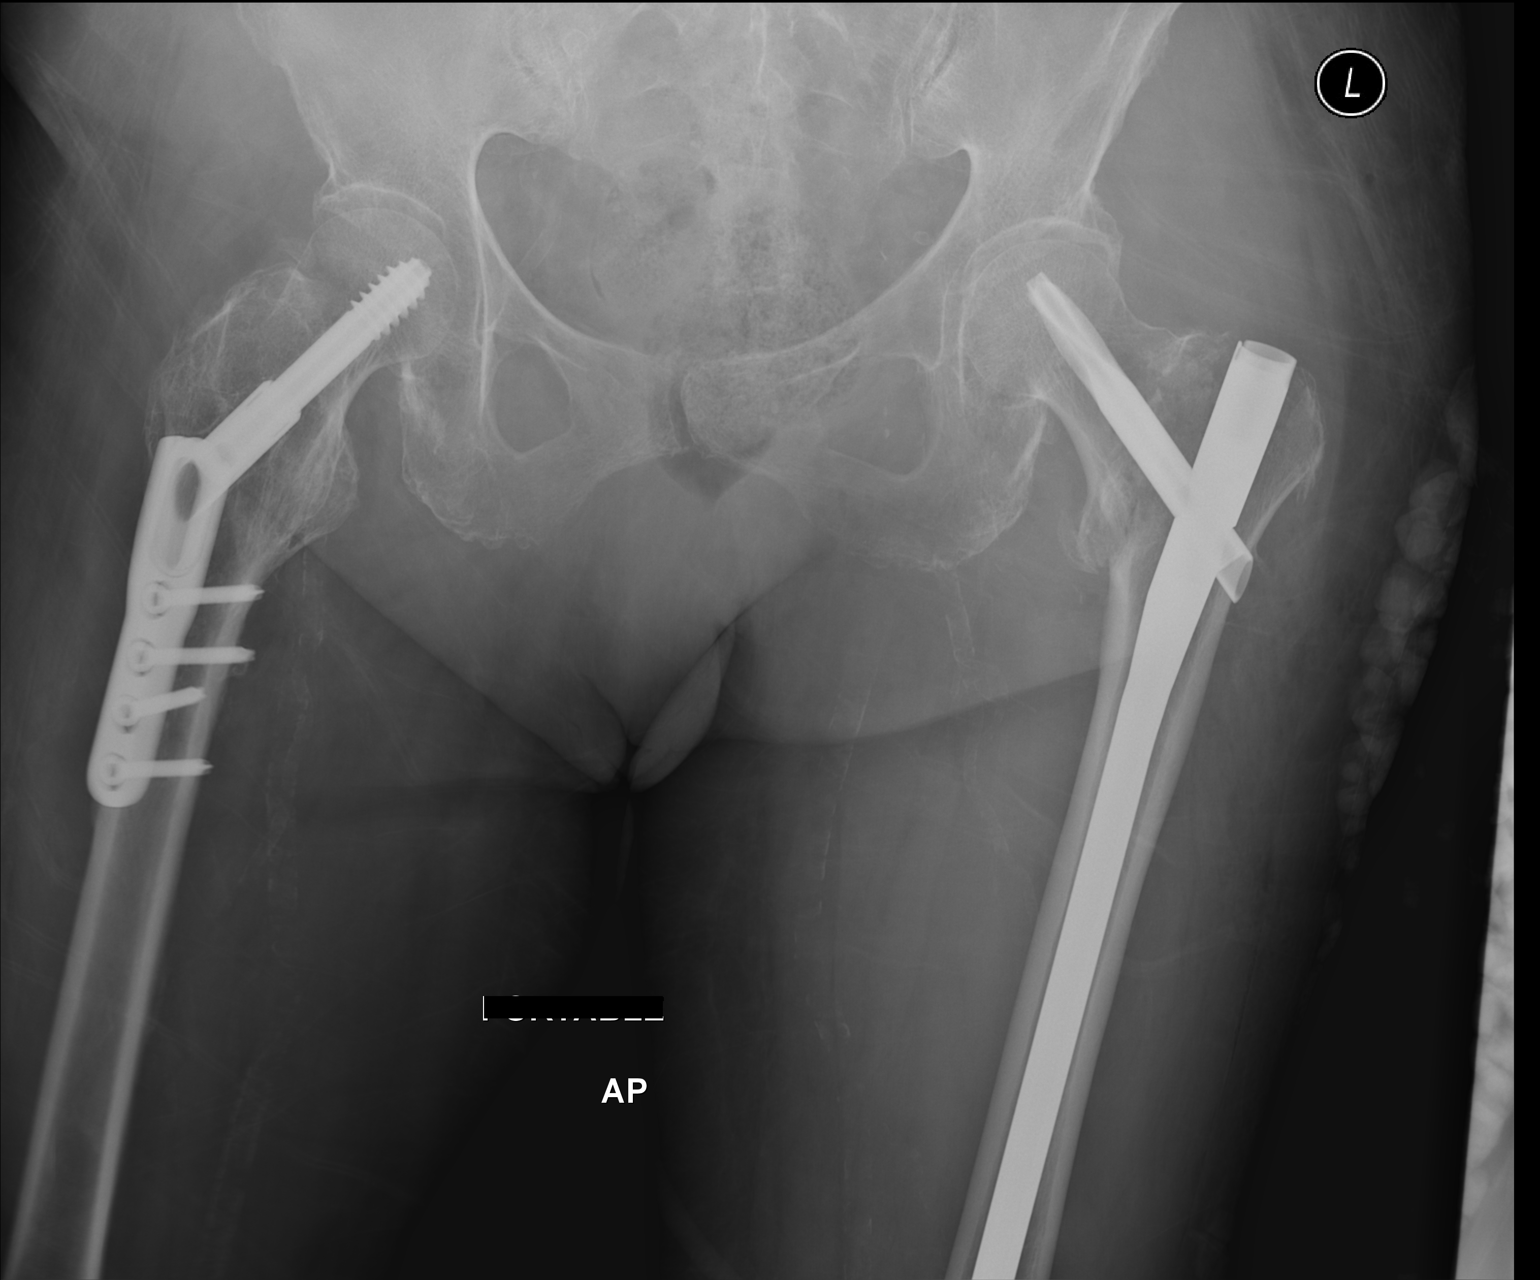

[AP (2 of 2)]
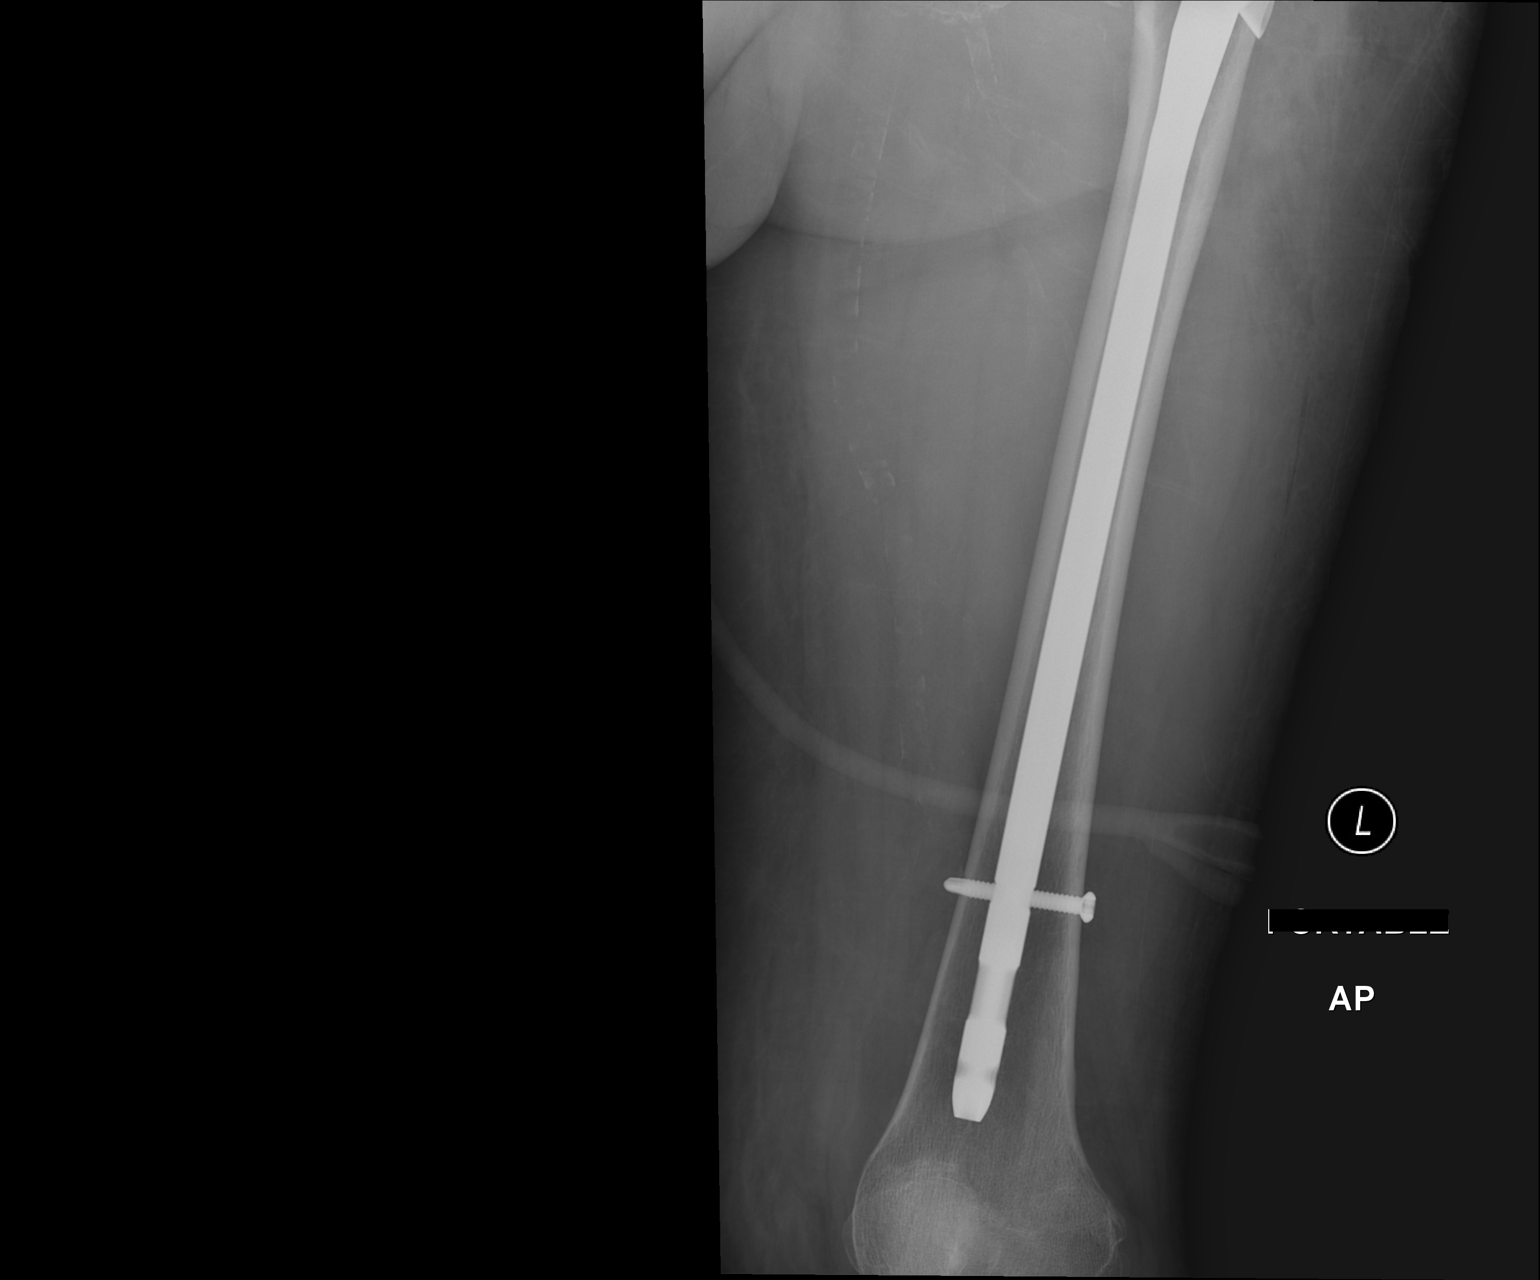

[2 of 2 positions shown; findings below may reference images not displayed]

FINDINGS: The patient's intramedullary rod and screw at the left femur
transfix the intertrochanteric fracture in grossly anatomic
alignment. No new fractures are seen. A minimally displaced lesser
trochanteric fragment is seen. Right femoral hardware appears
grossly intact, without evidence of loosening.

Scattered vascular calcifications are seen.
IMPRESSION: Status post internal fixation of the patient's left femoral
intertrochanteric fracture in grossly anatomic alignment. Minimally
displaced lesser trochanteric fragment seen.
